# Patient Record
Sex: Female | Born: 1995 | Hispanic: Yes | Marital: Single | Smoking: Never smoker
Health system: Southern US, Community
[De-identification: ages and names within clinical notes are randomized; demographics above are authoritative.]

## PROBLEM LIST (undated history)

## (undated) DIAGNOSIS — K08409 Partial loss of teeth, unspecified cause, unspecified class: Secondary | ICD-10-CM

## (undated) DIAGNOSIS — E282 Polycystic ovarian syndrome: Secondary | ICD-10-CM

## (undated) DIAGNOSIS — E039 Hypothyroidism, unspecified: Secondary | ICD-10-CM

## (undated) DIAGNOSIS — E079 Disorder of thyroid, unspecified: Secondary | ICD-10-CM

## (undated) HISTORY — PX: WISDOM TOOTH EXTRACTION: SHX21

---
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
## 2018-07-13 ENCOUNTER — Other Ambulatory Visit (INDEPENDENT_AMBULATORY_CARE_PROVIDER_SITE_OTHER): Payer: Self-pay | Admitting: Family Medicine

## 2018-08-15 ENCOUNTER — Other Ambulatory Visit (INDEPENDENT_AMBULATORY_CARE_PROVIDER_SITE_OTHER): Payer: Self-pay | Admitting: Family Medicine

## 2018-10-27 ENCOUNTER — Other Ambulatory Visit (INDEPENDENT_AMBULATORY_CARE_PROVIDER_SITE_OTHER): Payer: Self-pay | Admitting: Family Medicine

## 2018-12-26 ENCOUNTER — Other Ambulatory Visit (INDEPENDENT_AMBULATORY_CARE_PROVIDER_SITE_OTHER): Payer: Self-pay | Admitting: Family Medicine

## 2019-03-26 ENCOUNTER — Other Ambulatory Visit (INDEPENDENT_AMBULATORY_CARE_PROVIDER_SITE_OTHER): Payer: Self-pay | Admitting: Family Medicine

## 2019-04-05 ENCOUNTER — Other Ambulatory Visit (INDEPENDENT_AMBULATORY_CARE_PROVIDER_SITE_OTHER): Payer: Self-pay | Admitting: Family Medicine

## 2019-07-24 LAB — CBC AND DIFFERENTIAL
Baso(Absolute): 0.1 10*3/uL (ref 0.0–0.2)
Basos: 1 %
Eos: 3 %
Eosinophils Absolute: 0.2 10*3/uL (ref 0.0–0.4)
Hematocrit: 43.4 % (ref 34.0–46.6)
Hemoglobin: 14.1 g/dL (ref 11.1–15.9)
Immature Granulocytes Absolute: 0 10*3/uL (ref 0.0–0.1)
Immature Granulocytes: 0 %
Lymphocytes Absolute: 2.8 10*3/uL (ref 0.7–3.1)
Lymphocytes: 37 %
MCH: 30.2 pg (ref 26.6–33.0)
MCHC: 32.5 g/dL (ref 31.5–35.7)
MCV: 93 fL (ref 79–97)
Monocytes Absolute: 0.6 10*3/uL (ref 0.1–0.9)
Monocytes: 8 %
Neutrophils Absolute: 3.8 10*3/uL (ref 1.4–7.0)
Neutrophils: 51 %
Platelets: 265 10*3/uL (ref 150–450)
RBC: 4.67 x10E6/uL (ref 3.77–5.28)
RDW: 12.4 % (ref 11.7–15.4)
WBC: 7.4 10*3/uL (ref 3.4–10.8)

## 2019-07-24 LAB — COMPREHENSIVE METABOLIC PANEL
ALT: 15 IU/L (ref 0–32)
AST (SGOT): 24 IU/L (ref 0–40)
Albumin/Globulin Ratio: 2 (ref 1.2–2.2)
Albumin: 4.6 g/dL (ref 3.9–5.0)
Alkaline Phosphatase: 44 IU/L (ref 39–117)
BUN / Creatinine Ratio: 12 (ref 9–23)
BUN: 9 mg/dL (ref 6–20)
Bilirubin, Total: 0.4 mg/dL (ref 0.0–1.2)
CO2: 24 mmol/L (ref 20–29)
Calcium: 9.3 mg/dL (ref 8.7–10.2)
Chloride: 103 mmol/L (ref 96–106)
Creatinine: 0.74 mg/dL (ref 0.57–1.00)
EGFR: 117 mL/min/{1.73_m2} (ref >59)
EGFR: 135 mL/min/{1.73_m2} (ref >59)
Globulin, Total: 2.3 g/dL (ref 1.5–4.5)
Glucose: 84 mg/dL (ref 65–99)
Potassium: 4.4 mmol/L (ref 3.5–5.2)
Protein, Total: 6.9 g/dL (ref 6.0–8.5)
Sodium: 140 mmol/L (ref 134–144)

## 2019-07-24 LAB — EBV ACUTE INFECTION ANTIBODIES
EBV VCA Ab, IgG: 285 U/mL — ABNORMAL HIGH (ref 0.0–17.9)
EBV VCA Ab, IgM: >160 U/mL — ABNORMAL HIGH (ref 0.0–35.9)
Epstein-Barr Virus Early Antigen, IgG: 34.9 U/mL — ABNORMAL HIGH (ref 0.0–8.9)
Epstein-Barr virus, Nuclear AG AB: 312 U/mL — ABNORMAL HIGH (ref 0.0–17.9)

## 2019-07-24 LAB — REFLEX - LYME WESTERN BLOT
Lyme IgG Western Blot: NEGATIVE
Lyme IgM Western Blot: NEGATIVE
P18 Ab.: ABSENT
P23 Ab.: ABSENT
P23 Ab.: ABSENT
P28 Ab.: ABSENT
P30 Ab.: ABSENT
P39 Ab.: ABSENT
P39 Ab.: ABSENT
P41 Ab.: ABSENT
P41 Ab.: ABSENT
P45 Ab.: ABSENT
P58 Ab.: ABSENT
P66 Ab.: ABSENT
P93 Ab.: ABSENT

## 2019-07-24 LAB — THYROID SCREEN
T4, Free: 0.96 ng/dL (ref 0.82–1.77)
TSH: 2.08 u[IU]/mL (ref 0.450–4.500)

## 2019-07-24 LAB — VITAMIN D,25 OH,TOTAL: Vitamin D 25-Hydroxy: 40.3 ng/mL (ref 30.0–100.0)

## 2019-07-24 LAB — SEDIMENTATION RATE: Sed Rate: 7 mm/hr (ref 0–32)

## 2019-07-24 LAB — LYME AB, TOTAL,REFLEX TO WESTERN BLOT (IGG & IGM)
Lyme Disease Ab, Quant, IgM: 1.89 index — ABNORMAL HIGH (ref 0.00–0.79)
Lyme IgG/IgM Ab: 1.12 {ISR} — ABNORMAL HIGH (ref 0.00–0.90)

## 2019-07-24 LAB — ANA W/REFLEX TO MULTIPLE CONFIRMATORY TESTS IF POSITIVE: ANA Direct: NEGATIVE

## 2019-08-01 LAB — CBC AND DIFFERENTIAL
Baso(Absolute): 0.1 10*3/uL (ref 0.0–0.2)
Baso(Absolute): 0.1 10*3/uL (ref 0.0–0.2)
Baso(Absolute): 0.2 10*3/uL (ref 0.0–0.2)
Basos: 1 %
Basos: 2 %
Basos: 3 %
Eos: 2 %
Eos: 2 %
Eos: 4 %
Eosinophils Absolute: 0.1 10*3/uL (ref 0.0–0.4)
Eosinophils Absolute: 0.1 10*3/uL (ref 0.0–0.4)
Eosinophils Absolute: 0.1 10*3/uL (ref 0.0–0.4)
Hematocrit: 37.5 % (ref 34.0–46.6)
Hematocrit: 39.8 % (ref 34.0–46.6)
Hematocrit: 40.6 % (ref 34.0–46.6)
Hemoglobin: 13 g/dL (ref 11.1–15.9)
Hemoglobin: 13.1 g/dL (ref 11.1–15.9)
Hemoglobin: 13.2 g/dL (ref 11.1–15.9)
Immature Granulocytes Absolute: 0 10*3/uL (ref 0.0–0.1)
Immature Granulocytes Absolute: 0 10*3/uL (ref 0.0–0.1)
Immature Granulocytes Absolute: 0 10*3/uL (ref 0.0–0.1)
Immature Granulocytes: 0 %
Immature Granulocytes: 0 %
Immature Granulocytes: 0 %
Lymphocytes Absolute: 2.5 10*3/uL (ref 0.7–3.1)
Lymphocytes Absolute: 1.9 10*3/uL (ref 0.7–3.1)
Lymphocytes Absolute: 4.5 10*3/uL — ABNORMAL HIGH (ref 0.7–3.1)
Lymphocytes: 38 %
Lymphocytes: 47 %
Lymphocytes: 60 %
MCH: 30.4 pg (ref 26.6–33.0)
MCH: 29.5 pg (ref 26.6–33.0)
MCH: 29.8 pg (ref 26.6–33.0)
MCHC: 34.7 g/dL (ref 31.5–35.7)
MCHC: 32.5 g/dL (ref 31.5–35.7)
MCHC: 32.9 g/dL (ref 31.5–35.7)
MCV: 88 fL (ref 79–97)
MCV: 91 fL (ref 79–97)
MCV: 91 fL (ref 79–97)
Monocytes Absolute: 0.5 10*3/uL (ref 0.1–0.9)
Monocytes Absolute: 0.4 10*3/uL (ref 0.1–0.9)
Monocytes Absolute: 1 10*3/uL — ABNORMAL HIGH (ref 0.1–0.9)
Monocytes: 7 %
Monocytes: 14 %
Monocytes: 9 %
Neutrophils Absolute: 3.5 10*3/uL (ref 1.4–7.0)
Neutrophils Absolute: 1.5 10*3/uL (ref 1.4–7.0)
Neutrophils Absolute: 1.6 10*3/uL (ref 1.4–7.0)
Neutrophils: 52 %
Neutrophils: 21 %
Neutrophils: 38 %
Platelets: 216 10*3/uL (ref 150–450)
Platelets: 203 10*3/uL (ref 150–450)
Platelets: 227 10*3/uL (ref 150–450)
RBC: 4.28 x10E6/uL (ref 3.77–5.28)
RBC: 4.4 x10E6/uL (ref 3.77–5.28)
RBC: 4.47 x10E6/uL (ref 3.77–5.28)
RDW: 12.4 % (ref 12.3–15.4)
RDW: 12.8 % (ref 12.3–15.4)
RDW: 13.2 % (ref 12.3–15.4)
WBC: 6.7 10*3/uL (ref 3.4–10.8)
WBC: 4 10*3/uL (ref 3.4–10.8)
WBC: 7.4 10*3/uL (ref 3.4–10.8)

## 2019-08-01 LAB — COMPREHENSIVE METABOLIC PANEL
ALT: 10 IU/L (ref 0–32)
ALT: 99 IU/L — ABNORMAL HIGH (ref 0–32)
AST (SGOT): 24 IU/L (ref 0–40)
AST (SGOT): 96 IU/L — ABNORMAL HIGH (ref 0–40)
Albumin/Globulin Ratio: 1.7 (ref 1.2–2.2)
Albumin/Globulin Ratio: 1.4 (ref 1.2–2.2)
Albumin: 4.3 g/dL (ref 3.5–5.5)
Albumin: 4.1 g/dL (ref 3.5–5.5)
Alkaline Phosphatase: 38 IU/L — ABNORMAL LOW (ref 39–117)
Alkaline Phosphatase: 49 IU/L (ref 39–117)
BUN / Creatinine Ratio: 11 (ref 9–23)
BUN / Creatinine Ratio: 7 — ABNORMAL LOW (ref 9–23)
BUN: 8 mg/dL (ref 6–20)
BUN: 6 mg/dL (ref 6–20)
Bilirubin, Total: 0.4 mg/dL (ref 0.0–1.2)
Bilirubin, Total: 0.3 mg/dL (ref 0.0–1.2)
CO2: 22 mmol/L (ref 20–29)
CO2: 23 mmol/L (ref 20–29)
Calcium: 9.1 mg/dL (ref 8.7–10.2)
Calcium: 8.9 mg/dL (ref 8.7–10.2)
Chloride: 105 mmol/L (ref 96–106)
Chloride: 102 mmol/L (ref 96–106)
Creatinine: 0.75 mg/dL (ref 0.57–1.00)
Creatinine: 0.81 mg/dL (ref 0.57–1.00)
EGFR: 116 mL/min/{1.73_m2} (ref >59)
EGFR: 134 mL/min/{1.73_m2} (ref >59)
EGFR: 106 mL/min/{1.73_m2} (ref >59)
EGFR: 122 mL/min/{1.73_m2} (ref >59)
Globulin, Total: 2.5 g/dL (ref 1.5–4.5)
Globulin, Total: 2.9 g/dL (ref 1.5–4.5)
Glucose: 84 mg/dL (ref 65–99)
Glucose: 99 mg/dL (ref 65–99)
Potassium: 4 mmol/L (ref 3.5–5.2)
Potassium: 3.9 mmol/L (ref 3.5–5.2)
Protein, Total: 6.8 g/dL (ref 6.0–8.5)
Protein, Total: 7 g/dL (ref 6.0–8.5)
Sodium: 142 mmol/L (ref 134–144)
Sodium: 140 mmol/L (ref 134–144)

## 2019-08-01 LAB — THYROID SCREEN
T4, Free: 0.98 ng/dL (ref 0.93–1.60)
T4, Free: 1.03 ng/dL (ref 0.93–1.60)
TSH: 1.14 u[IU]/mL (ref 0.450–4.500)
TSH: 0.757 u[IU]/mL (ref 0.450–4.500)

## 2019-08-01 LAB — REFLEX - LYME WESTERN BLOT
Lyme IgG Western Blot: NEGATIVE
Lyme IgM Western Blot: NEGATIVE
P18 Ab.: ABSENT
P23 Ab.: ABSENT
P23 Ab.: ABSENT
P28 Ab.: ABSENT
P30 Ab.: ABSENT
P39 Ab.: ABSENT
P39 Ab.: ABSENT
P41 Ab.: ABSENT
P41 Ab.: ABSENT
P45 Ab.: ABSENT
P58 Ab.: ABSENT
P66 Ab.: ABSENT
P93 Ab.: ABSENT

## 2019-08-01 LAB — CULTURE, AEROBIC, GENITAL

## 2019-08-01 LAB — IRON PROFILE
Iron Saturation: 22 % (ref 15–55)
Iron: 66 ug/dL (ref 27–159)
TIBC: 306 ug/dL (ref 250–450)
UIBC: 240 ug/dL (ref 131–425)

## 2019-08-01 LAB — EBV ACUTE INFECTION ANTIBODIES
EBV VCA Ab, IgG: 242 U/mL — ABNORMAL HIGH (ref 0.0–17.9)
EBV VCA Ab, IgM: >160 U/mL — ABNORMAL HIGH (ref 0.0–35.9)
Epstein-Barr Virus Early Antigen, IgG: 47.6 U/mL — ABNORMAL HIGH (ref 0.0–8.9)
Epstein-Barr virus, Nuclear AG AB: 129 U/mL — ABNORMAL HIGH (ref 0.0–17.9)

## 2019-08-01 LAB — CHLAMYDIA GONORRHOEAE NAA
CHLAMYDIA TRACHOMATIS, NAA: NEGATIVE
Neisseria gonorrhoeae, NAA: NEGATIVE

## 2019-08-01 LAB — HEPATIC FUNCTION PANEL
ALT: 16 IU/L (ref 0–32)
AST (SGOT): 21 IU/L (ref 0–40)
Albumin: 4.3 g/dL (ref 3.5–5.5)
Alkaline Phosphatase: 41 IU/L (ref 39–117)
Bilirubin Direct: 0.08 mg/dL (ref 0.00–0.40)
Bilirubin, Total: 0.3 mg/dL (ref 0.0–1.2)
Protein, Total: 7.3 g/dL (ref 6.0–8.5)

## 2019-08-01 LAB — HBSAG: Hepatitis B Surface Antigen: NEGATIVE

## 2019-08-01 LAB — LYME AB, TOTAL,REFLEX TO WESTERN BLOT (IGG & IGM)
Lyme Disease Ab, Quant, IgM: 2.37 index — ABNORMAL HIGH (ref 0.00–0.79)
Lyme IgG/IgM Ab: 1.3 {ISR} — ABNORMAL HIGH (ref 0.00–0.90)

## 2019-08-01 LAB — THYROGLOBULIN ANTIBODY: THYROGLOBULIN AB: <1 IU/mL (ref 0.0–0.9)

## 2019-08-01 LAB — LIPID PANEL, WITHOUT TOTAL CHOLESTEROL/HDL RATIO, SERUM
Cholesterol: 121 mg/dL (ref 100–169)
HDL: 41 mg/dL (ref >39)
LDL Calculated: 65 mg/dL (ref 0–109)
Triglycerides: 73 mg/dL (ref 0–89)
VLDL Calculated: 15 mg/dL (ref 5–40)

## 2019-08-01 LAB — HCV AB W/REFLEX TO RIBA PROFILE: HCV AB: <0.1 s/co ratio (ref 0.0–0.9)

## 2019-08-01 LAB — COMMENT:

## 2019-08-01 LAB — VITAMIN B12: Vitamin B-12: 650 pg/mL (ref 232–1245)

## 2019-08-01 LAB — THYROID STIMULATING HORMONE (TSH), REFLEX ON ABNORMAL TO FREE T4, SERUM: TSH: 5.78 u[IU]/mL — ABNORMAL HIGH (ref 0.450–4.500)

## 2019-08-01 LAB — THYROID PEROXIDASE ANTIBODY: Thyroid Peroxidase (TPO) AB: 28 IU/mL — ABNORMAL HIGH (ref 0–26)

## 2019-08-01 LAB — ANA W/REFLEX TO MULTIPLE CONFIRMATORY TESTS IF POSITIVE: ANA Direct: NEGATIVE

## 2019-08-01 LAB — REFLEX - T4,FREE (DIRECT): T4, Free: 1.15 ng/dL (ref 0.93–1.60)

## 2019-08-01 LAB — RHEUMATOID FACTOR: RA Latex Turbid.: <10 IU/mL (ref 0.0–13.9)

## 2019-08-16 ENCOUNTER — Other Ambulatory Visit: Payer: Self-pay | Admitting: Sports Medicine

## 2019-08-16 DIAGNOSIS — G8929 Other chronic pain: Secondary | ICD-10-CM

## 2019-08-22 ENCOUNTER — Other Ambulatory Visit: Payer: Self-pay

## 2019-08-22 ENCOUNTER — Ambulatory Visit
Admission: RE | Admit: 2019-08-22 | Discharge: 2019-08-22 | Disposition: A | Discharge status: Home / Self Care | Payer: BC Managed Care – PPO | Source: Ambulatory Visit | Attending: Sports Medicine | Admitting: Sports Medicine

## 2019-08-22 DIAGNOSIS — M25561 Pain in right knee: Secondary | ICD-10-CM | POA: Diagnosis not present

## 2019-08-22 DIAGNOSIS — G8929 Other chronic pain: Secondary | ICD-10-CM | POA: Diagnosis present

## 2019-08-27 ENCOUNTER — Ambulatory Visit: Payer: Self-pay

## 2019-09-19 ENCOUNTER — Encounter (INDEPENDENT_AMBULATORY_CARE_PROVIDER_SITE_OTHER): Payer: Self-pay | Admitting: Family Medicine

## 2019-09-19 ENCOUNTER — Encounter: Payer: Self-pay | Admitting: Emergency Medicine

## 2019-09-19 ENCOUNTER — Other Ambulatory Visit: Payer: Self-pay

## 2019-09-19 DIAGNOSIS — E039 Hypothyroidism, unspecified: Secondary | ICD-10-CM | POA: Diagnosis not present

## 2019-09-19 DIAGNOSIS — M25512 Pain in left shoulder: Secondary | ICD-10-CM | POA: Insufficient documentation

## 2019-09-19 DIAGNOSIS — R0789 Other chest pain: Secondary | ICD-10-CM | POA: Diagnosis not present

## 2019-09-19 DIAGNOSIS — R079 Chest pain, unspecified: Secondary | ICD-10-CM | POA: Diagnosis present

## 2019-09-19 DIAGNOSIS — R5383 Other fatigue: Secondary | ICD-10-CM | POA: Insufficient documentation

## 2019-09-19 DIAGNOSIS — F32A Depression, unspecified: Secondary | ICD-10-CM | POA: Insufficient documentation

## 2019-09-19 DIAGNOSIS — R42 Dizziness and giddiness: Secondary | ICD-10-CM | POA: Insufficient documentation

## 2019-09-19 DIAGNOSIS — G43909 Migraine, unspecified, not intractable, without status migrainosus: Secondary | ICD-10-CM | POA: Insufficient documentation

## 2019-09-19 DIAGNOSIS — E739 Lactose intolerance, unspecified: Secondary | ICD-10-CM | POA: Insufficient documentation

## 2019-09-19 DIAGNOSIS — R55 Syncope and collapse: Secondary | ICD-10-CM | POA: Insufficient documentation

## 2019-09-19 DIAGNOSIS — R110 Nausea: Secondary | ICD-10-CM | POA: Insufficient documentation

## 2019-09-19 DIAGNOSIS — B2709 Gammaherpesviral mononucleosis with other complications: Secondary | ICD-10-CM | POA: Insufficient documentation

## 2019-09-19 DIAGNOSIS — R519 Headache, unspecified: Secondary | ICD-10-CM | POA: Insufficient documentation

## 2019-09-19 DIAGNOSIS — F419 Anxiety disorder, unspecified: Secondary | ICD-10-CM | POA: Insufficient documentation

## 2019-09-19 DIAGNOSIS — B2790 Infectious mononucleosis, unspecified without complication: Secondary | ICD-10-CM | POA: Insufficient documentation

## 2019-09-19 DIAGNOSIS — R251 Tremor, unspecified: Secondary | ICD-10-CM | POA: Insufficient documentation

## 2019-09-19 LAB — CBC
HCT: 40.4 % (ref 36.0–46.0)
Hemoglobin: 14 g/dL (ref 12.0–15.0)
MCH: 30.2 pg (ref 26.0–34.0)
MCHC: 34.7 g/dL (ref 30.0–36.0)
MCV: 87.3 fL (ref 80.0–100.0)
Platelets: 259 10*3/uL (ref 150–400)
RBC: 4.63 MIL/uL (ref 3.87–5.11)
RDW: 12 % (ref 11.5–15.5)
WBC: 7.7 10*3/uL (ref 4.0–10.5)
nRBC: 0 % (ref 0.0–0.2)

## 2019-09-19 NOTE — ED Triage Notes (Signed)
Pt presents to ED with left sided chest and shoulder pain. Pt states onset of pain was around 2100 while sitting down at home. Pt reports pain started in her left ear and radiated down into her chest. Ear pain has resolved. Pt currently has no increased work of breathing or acute distress noted. Skin warm and dry.

## 2019-09-20 ENCOUNTER — Emergency Department
Admission: EM | Admit: 2019-09-20 | Discharge: 2019-09-20 | Disposition: A | Discharge status: Home / Self Care | Payer: BC Managed Care – PPO | Attending: Emergency Medicine | Admitting: Emergency Medicine

## 2019-09-20 ENCOUNTER — Emergency Department: Payer: BC Managed Care – PPO

## 2019-09-20 DIAGNOSIS — R079 Chest pain, unspecified: Secondary | ICD-10-CM

## 2019-09-20 DIAGNOSIS — R0789 Other chest pain: Secondary | ICD-10-CM

## 2019-09-20 HISTORY — DX: Disorder of thyroid, unspecified: E07.9

## 2019-09-20 HISTORY — DX: Polycystic ovarian syndrome: E28.2

## 2019-09-20 LAB — BASIC METABOLIC PANEL
Anion gap: 11 (ref 5–15)
BUN: 5 mg/dL — ABNORMAL LOW (ref 6–20)
CO2: 25 mmol/L (ref 22–32)
Calcium: 9.2 mg/dL (ref 8.9–10.3)
Chloride: 104 mmol/L (ref 98–111)
Creatinine, Ser: 0.69 mg/dL (ref 0.44–1.00)
GFR calc Af Amer: >60 mL/min (ref >60)
GFR calc non Af Amer: >60 mL/min (ref >60)
Glucose, Bld: 110 mg/dL — ABNORMAL HIGH (ref 70–99)
Potassium: 3.5 mmol/L (ref 3.5–5.1)
Sodium: 140 mmol/L (ref 135–145)

## 2019-09-20 LAB — TROPONIN I (HIGH SENSITIVITY)
Troponin I (High Sensitivity): 2 ng/L (ref <18)
Troponin I (High Sensitivity): 2 ng/L (ref <18)

## 2019-09-20 MED ORDER — SODIUM CHLORIDE 0.9 % IV BOLUS
1000.0000 mL | Freq: Once | INTRAVENOUS | Status: AC
Start: 2019-09-20 — End: 2019-09-20
  Administered 2019-09-20: 1000 mL via INTRAVENOUS

## 2019-09-20 MED ORDER — KETOROLAC TROMETHAMINE 30 MG/ML IJ SOLN
10.0000 mg | Freq: Once | INTRAMUSCULAR | Status: AC
  Administered 2019-09-20: 9.9 mg via INTRAVENOUS
  Filled 2019-09-20: qty 1

## 2019-09-20 MED ORDER — IOHEXOL 350 MG/ML SOLN
100.0000 mL | Freq: Once | INTRAVENOUS | Status: AC | PRN
  Administered 2019-09-20: 100 mL via INTRAVENOUS

## 2019-09-20 NOTE — ED Notes (Signed)
ED Provider at bedside. 

## 2019-09-20 NOTE — ED Notes (Signed)
Patient recently wore a heart monitor for two weeks.

## 2019-09-20 NOTE — ED Provider Notes (Signed)
[facility] Emergency Department Provider Note   ____________________________________________   First MD Initiated Contact with Patient 09/20/19 0202     (approximate)  I have reviewed the triage vital signs and the nursing notes.   HISTORY  Chief Complaint Chest Pain and Shoulder Pain    HPI Alexandria Reeves is a 23 y.o. female who presents to the ED from home with a chief complaint of chest pain.  Patient reports eating around 9 PM and subsequently had sharp left ear pain which radiated into her chest and left shoulder.  Painful on movement.  Recently turned in a 2-week Holter monitor for palpitations and variable blood pressures.  Denies fever, cough, shortness of breath, abdominal pain, nausea or vomiting.  Took a road trip to [place] last weekend and is also taking OCPs.       Past Medical History:  Diagnosis Date  . PCOS (polycystic ovarian syndrome)   . Thyroid disease     There are no active problems to display for this patient.   Past Surgical History:  Procedure Laterality Date  . WISDOM TOOTH EXTRACTION      Prior to Admission medications   Not on File    Allergies Patient has no known allergies.  No family history on file.  Social History Social History   Tobacco Use  . Smoking status: Never Smoker  . Smokeless tobacco: Never Used  Substance Use Topics  . Alcohol use: Not Currently  . Drug use: Never    Review of Systems  Constitutional: No fever/chills Eyes: No visual changes. ENT: Positive for left ear pain.  No sore throat. Cardiovascular: Positive for chest pain. Respiratory: Denies shortness of breath. Gastrointestinal: No abdominal pain.  No nausea, no vomiting.  No diarrhea.  No constipation. Genitourinary: Negative for dysuria. Musculoskeletal: Negative for back pain. Skin: Negative for rash. Neurological: Negative for headaches, focal weakness or numbness.   ____________________________________________   PHYSICAL EXAM:  VITAL SIGNS: ED Triage Vitals  Enc Vitals Group     BP 09/19/19 2324 138/82     Pulse Rate 09/19/19 2324 67     Resp 09/19/19 2324 18     Temp 09/19/19 2324 98.7 F (37.1 C)     Temp Source 09/19/19 2324 Oral     SpO2 09/19/19 2324 98 %     Weight 09/19/19 2325 145 lb (65.8 kg)     Height 09/19/19 2325 5\' 6"  (1.676 m)     Head Circumference --      Peak Flow --      Pain Score 09/19/19 2325 7     Pain Loc --      Pain Edu? --      Excl. in [place]? --     Constitutional: Alert and oriented. Well appearing and in no acute distress. Eyes: Conjunctivae are normal. PERRL. EOMI. Head: Atraumatic. Nose: No congestion/rhinnorhea. Ears: Bilateral TMs unremarkable. Mouth/Throat: Mucous membranes are moist.  Oropharynx non-erythematous. Neck: No stridor.   Cardiovascular: Normal rate, regular rhythm. Grossly normal heart sounds.  Good peripheral circulation. Respiratory: Normal respiratory effort.  No retractions. Lungs CTAB.  Anterior chest tender to palpation and with movement of trunk. Gastrointestinal: Soft and nontender. No distention. No abdominal bruits. No CVA tenderness. Musculoskeletal: No lower extremity tenderness nor edema.  No joint effusions. Neurologic:  Normal speech and language. No gross focal neurologic deficits are appreciated. No gait instability. Skin:  Skin is warm, dry and intact. No rash noted. Psychiatric: Mood and affect are  normal. Speech and behavior are normal.  ____________________________________________   LABS (all labs ordered are listed, but only abnormal results are displayed)  Labs Reviewed  BASIC METABOLIC PANEL - Abnormal; Notable for the following components:      Result Value   Glucose, Bld 110 (*)    BUN 5 (*)    All other components within normal limits  CBC  POC URINE PREG, ED  TROPONIN I (HIGH SENSITIVITY)  TROPONIN I (HIGH SENSITIVITY)   ____________________________________________  EKG  ED ECG REPORT I,  Crissie Aloi J, the attending physician, personally viewed and interpreted this ECG.   Date: 09/20/2019  EKG Time: 2329  Rate: 65  Rhythm: normal EKG, normal sinus rhythm  Axis: Normal  Intervals:none  ST&T Change: Nonspecific  ____________________________________________  RADIOLOGY  ED MD interpretation: No acute cardiopulmonary process; no PE  Official radiology report(s): Dg Chest 2 View  Result Date: 09/20/2019 CLINICAL DATA:  Chest pain. Left-sided chest and shoulder pain. EXAM: CHEST - 2 VIEW COMPARISON:  None. FINDINGS: The cardiomediastinal contours are normal. The lungs are clear. Pulmonary vasculature is normal. No consolidation, pleural effusion, or pneumothorax. No acute osseous abnormalities are seen. IMPRESSION: Normal radiographs of the chest. Electronically Signed   By: Narda RutherfordMelanie  Sanford M.D.   On: 09/20/2019 01:08   Ct Angio Chest Pe W/cm &/or Wo Cm  Result Date: 09/20/2019 CLINICAL DATA:  Left chest and shoulder pain. EXAM: CT ANGIOGRAPHY CHEST WITH CONTRAST TECHNIQUE: Multidetector CT imaging of the chest was performed using the standard protocol during bolus administration of intravenous contrast. Multiplanar CT image reconstructions and MIPs were obtained to evaluate the vascular anatomy. CONTRAST:  100mL OMNIPAQUE IOHEXOL 350 MG/ML SOLN COMPARISON:  Radiograph earlier this day. FINDINGS: Cardiovascular: There are no filling defects within the pulmonary arteries to suggest pulmonary embolus. The thoracic aorta is normal in caliber. No dissection. Heart is normal in size. No pericardial effusion. Mediastinum/Nodes: Residual thymus in the anterior mediastinum. No adenopathy. No visualized thyroid nodule. Esophagus is decompressed. Lungs/Pleura: Clear lungs. No consolidation, pleural fluid, or pulmonary edema. Upper Abdomen: Negative. Musculoskeletal: No chest wall abnormality. No acute or significant osseous findings. Review of the MIP images confirms the above findings.  IMPRESSION: Negative CTA of the chest. No pulmonary embolus or acute intrathoracic abnormality. Electronically Signed   By: Narda RutherfordMelanie  Sanford M.D.   On: 09/20/2019 03:28    ____________________________________________   PROCEDURES  Procedure(s) performed (including Critical Care):  Procedures   ____________________________________________   INITIAL IMPRESSION / ASSESSMENT AND PLAN / ED COURSE  As part of my medical decision making, I reviewed the following data within the electronic MEDICAL RECORD NUMBER History obtained from family, Nursing notes reviewed and incorporated, Labs reviewed, EKG interpreted, Old chart reviewed, Radiograph reviewed and Notes from prior ED visits     Tish Fredericksonlexis Brickner was evaluated in Emergency Department on 09/20/2019 for the symptoms described in the history of present illness. She was evaluated in the context of the global COVID-19 pandemic, which necessitated consideration that the patient might be at risk for infection with the SARS-CoV-2 virus that causes COVID-19. Institutional protocols and algorithms that pertain to the evaluation of patients at risk for COVID-19 are in a state of rapid change based on information released by regulatory bodies including the CDC and federal and state organizations. These policies and algorithms were followed during the patient's care in the ED.    23 year old female who presents with pleuritic chest pain. Differential diagnosis includes, but is not limited to, ACS, aortic dissection,  pulmonary embolism, cardiac tamponade, pneumothorax, pneumonia, pericarditis, myocarditis, GI-related causes including esophagitis/gastritis, and musculoskeletal chest wall pain.    Initial EKG and troponin unremarkable.  Will obtain CTA chest to evaluate for PE. Administer IV Toradol for discomfort.   Clinical Course as of Sep 20 347  Thu Sep 20, 2019  3790 Updated patient on negative repeat troponin and CT scan.  She is feeling better after  IV Toradol.  Strict return precautions given.  Patient verbalizes understanding and agrees with plan of care.   [JS]    Clinical Course User Index [JS] Irean Hong, MD     ____________________________________________   FINAL CLINICAL IMPRESSION(S) / ED DIAGNOSES  Final diagnoses:  Chest wall pain  Chest pain, unspecified type     ED Discharge Orders    None       Note:  This document was prepared using Dragon voice recognition software and may include unintentional dictation errors.   Irean Hong, MD 09/20/19 [phone]

## 2019-09-20 NOTE — ED Notes (Signed)
Patient states having ear pain chest pain localized on left side.

## 2019-09-20 NOTE — Discharge Instructions (Addendum)
You may take Tylenol and/or Ibuprofen as needed for chest discomfort.  Apply moist heat to affected area several times daily.  Return to the ER for worsening symptoms, persistent vomiting, difficulty breathing or other concerns.

## 2019-09-22 ENCOUNTER — Other Ambulatory Visit (INDEPENDENT_AMBULATORY_CARE_PROVIDER_SITE_OTHER): Payer: Self-pay | Admitting: Family Medicine

## 2019-09-22 DIAGNOSIS — E039 Hypothyroidism, unspecified: Secondary | ICD-10-CM

## 2019-09-22 MED ORDER — LEVOTHYROXINE SODIUM 25 MCG PO TABS
ORAL_TABLET | ORAL | 1 refills | Status: DC
Start: 2019-09-22 — End: 2020-06-10

## 2019-09-22 NOTE — Progress Notes (Signed)
Received fax from [facility] for rx refill request of Levothyroxine .  Sent in rx #90 with 1RF along with reminder for pt to schedule WWE.

## 2019-09-24 ENCOUNTER — Encounter (INDEPENDENT_AMBULATORY_CARE_PROVIDER_SITE_OTHER): Payer: Self-pay | Admitting: Family Medicine

## 2019-09-24 ENCOUNTER — Encounter (INDEPENDENT_AMBULATORY_CARE_PROVIDER_SITE_OTHER): Payer: BLUE CROSS/BLUE SHIELD | Admitting: Family Medicine

## 2019-09-24 NOTE — Progress Notes (Signed)
This encounter was created in error - please disregard.    Items noted as "reviewed" are for administrative purposes only and are not guaranteed by the provider to be accurate on this date.

## 2019-10-25 ENCOUNTER — Encounter (INDEPENDENT_AMBULATORY_CARE_PROVIDER_SITE_OTHER): Payer: Self-pay

## 2019-11-27 ENCOUNTER — Other Ambulatory Visit: Payer: Self-pay | Admitting: Orthopedic Surgery

## 2019-11-28 ENCOUNTER — Other Ambulatory Visit: Payer: Self-pay

## 2019-11-30 ENCOUNTER — Other Ambulatory Visit
Admission: RE | Admit: 2019-11-30 | Discharge: 2019-11-30 | Disposition: A | Discharge status: Home / Self Care | Payer: BC Managed Care – PPO | Source: Ambulatory Visit | Attending: Orthopedic Surgery | Admitting: Orthopedic Surgery

## 2019-11-30 DIAGNOSIS — Z01812 Encounter for preprocedural laboratory examination: Secondary | ICD-10-CM | POA: Diagnosis not present

## 2019-11-30 DIAGNOSIS — Z20828 Contact with and (suspected) exposure to other viral communicable diseases: Secondary | ICD-10-CM | POA: Insufficient documentation

## 2019-11-30 LAB — SARS CORONAVIRUS 2 (TAT 6-24 HRS): SARS Coronavirus 2: NEGATIVE

## 2019-12-03 NOTE — Anesthesia Preprocedure Evaluation (Addendum)
Anesthesia Evaluation  Patient identified by MRN, date of birth, ID band Patient awake    Reviewed: Allergy & Precautions, NPO status , Patient's Chart, lab work & pertinent test results  History of Anesthesia Complications Negative for: history of anesthetic complications  Airway Mallampati: I   Neck ROM: Full    Dental   Upper temporary crown:   Pulmonary neg pulmonary ROS,    Pulmonary exam normal breath sounds clear to auscultation       Cardiovascular Exercise Tolerance: Good negative cardio ROS Normal cardiovascular exam Rhythm:Regular Rate:Normal  ECG 09/19/19: NSR with sinus arrhythmia   Neuro/Psych negative neurological ROS     GI/Hepatic negative GI ROS,   Endo/Other  Hypothyroidism PCOS  Renal/GU negative Renal ROS     Musculoskeletal   Abdominal   Peds  Hematology negative hematology ROS (+)   Anesthesia Other Findings   Reproductive/Obstetrics                            Anesthesia Physical Anesthesia Plan  ASA: II  Anesthesia Plan: General   Post-op Pain Management:    Induction:   PONV Risk Score and Plan: 3 and Dexamethasone, Ondansetron and Treatment may vary due to age or medical condition  Airway Management Planned: LMA  Additional Equipment:   Intra-op Plan:   Post-operative Plan: Extubation in OR  Informed Consent: I have reviewed the patients History and Physical, chart, labs and discussed the procedure including the risks, benefits and alternatives for the proposed anesthesia with the patient or authorized representative who has indicated his/her understanding and acceptance.       Plan Discussed with: CRNA  Anesthesia Plan Comments:        Anesthesia Quick Evaluation

## 2019-12-04 ENCOUNTER — Encounter
Admission: RE | Disposition: A | Discharge status: Home / Self Care | Payer: Self-pay | Source: Home / Self Care | Attending: Orthopedic Surgery

## 2019-12-04 ENCOUNTER — Other Ambulatory Visit: Payer: Self-pay

## 2019-12-04 ENCOUNTER — Ambulatory Visit: Payer: BC Managed Care – PPO | Admitting: Anesthesiology

## 2019-12-04 ENCOUNTER — Ambulatory Visit
Admission: RE | Admit: 2019-12-04 | Discharge: 2019-12-04 | Disposition: A | Discharge status: Home / Self Care | Payer: BC Managed Care – PPO | Attending: Orthopedic Surgery | Admitting: Orthopedic Surgery

## 2019-12-04 DIAGNOSIS — Z791 Long term (current) use of non-steroidal anti-inflammatories (NSAID): Secondary | ICD-10-CM | POA: Diagnosis not present

## 2019-12-04 DIAGNOSIS — Z79890 Hormone replacement therapy (postmenopausal): Secondary | ICD-10-CM | POA: Insufficient documentation

## 2019-12-04 DIAGNOSIS — M25861 Other specified joint disorders, right knee: Secondary | ICD-10-CM | POA: Insufficient documentation

## 2019-12-04 DIAGNOSIS — Z793 Long term (current) use of hormonal contraceptives: Secondary | ICD-10-CM | POA: Insufficient documentation

## 2019-12-04 DIAGNOSIS — M6751 Plica syndrome, right knee: Secondary | ICD-10-CM | POA: Diagnosis not present

## 2019-12-04 DIAGNOSIS — F419 Anxiety disorder, unspecified: Secondary | ICD-10-CM | POA: Diagnosis not present

## 2019-12-04 DIAGNOSIS — M238X1 Other internal derangements of right knee: Secondary | ICD-10-CM | POA: Diagnosis not present

## 2019-12-04 DIAGNOSIS — E039 Hypothyroidism, unspecified: Secondary | ICD-10-CM | POA: Insufficient documentation

## 2019-12-04 HISTORY — PX: KNEE ARTHROSCOPY WITH EXCISION PLICA: SHX5647

## 2019-12-04 HISTORY — DX: Partial loss of teeth, unspecified cause, unspecified class: K08.409

## 2019-12-04 HISTORY — DX: Hypothyroidism, unspecified: E03.9

## 2019-12-04 LAB — POCT PREGNANCY, URINE: Preg Test, Ur: NEGATIVE

## 2019-12-04 SURGERY — ARTHROSCOPY, KNEE, WITH PLICA EXCISION
Anesthesia: General | Site: Knee | Laterality: Right

## 2019-12-04 MED ORDER — GLYCOPYRROLATE 0.2 MG/ML IJ SOLN
INTRAMUSCULAR | Status: DC | PRN
  Administered 2019-12-04: 0.1 mg via INTRAVENOUS

## 2019-12-04 MED ORDER — OXYCODONE HCL 5 MG PO TABS: 5.0000 mg | ORAL_TABLET | Freq: Once | ORAL | Status: AC | PRN

## 2019-12-04 MED ORDER — ONDANSETRON 4 MG PO TBDP: 4.0000 mg | ORAL_TABLET | Freq: Three times a day (TID) | ORAL | 0 refills | Status: AC | PRN

## 2019-12-04 MED ORDER — ACETAMINOPHEN 500 MG PO TABS: 1000.0000 mg | ORAL_TABLET | Freq: Three times a day (TID) | ORAL | 2 refills | Status: AC

## 2019-12-04 MED ORDER — CHLORHEXIDINE GLUCONATE 4 % EX LIQD: 60.0000 mL | Freq: Once | CUTANEOUS | Status: DC

## 2019-12-04 MED ORDER — LACTATED RINGERS IV SOLN: INTRAVENOUS | Status: DC

## 2019-12-04 MED ORDER — PROPOFOL 10 MG/ML IV BOLUS
INTRAVENOUS | Status: DC | PRN
  Administered 2019-12-04: 190 mg via INTRAVENOUS

## 2019-12-04 MED ORDER — LACTATED RINGERS IV SOLN
10.0000 mL/h | INTRAVENOUS | Status: DC
  Administered 2019-12-04: 14:00:00 via INTRAVENOUS

## 2019-12-04 MED ORDER — DEXAMETHASONE SODIUM PHOSPHATE 4 MG/ML IJ SOLN
INTRAMUSCULAR | Status: DC | PRN
  Administered 2019-12-04: 4 mg via INTRAVENOUS

## 2019-12-04 MED ORDER — OXYCODONE HCL 5 MG/5ML PO SOLN
5.0000 mg | Freq: Once | ORAL | Status: AC | PRN
  Administered 2019-12-04: 5 mg via ORAL

## 2019-12-04 MED ORDER — LIDOCAINE HCL (CARDIAC) PF 100 MG/5ML IV SOSY
PREFILLED_SYRINGE | INTRAVENOUS | Status: DC | PRN
  Administered 2019-12-04: 30 mg via INTRATRACHEAL

## 2019-12-04 MED ORDER — ASPIRIN EC 325 MG PO TBEC: 325.0000 mg | DELAYED_RELEASE_TABLET | Freq: Every day | ORAL | 0 refills | Status: AC

## 2019-12-04 MED ORDER — ONDANSETRON HCL 4 MG/2ML IJ SOLN: 4.0000 mg | Freq: Once | INTRAMUSCULAR | Status: DC | PRN

## 2019-12-04 MED ORDER — FENTANYL CITRATE (PF) 100 MCG/2ML IJ SOLN
25.0000 ug | INTRAMUSCULAR | Status: DC | PRN
  Administered 2019-12-04 (×2): 25 ug via INTRAVENOUS

## 2019-12-04 MED ORDER — LIDOCAINE-EPINEPHRINE 1 %-1:100000 IJ SOLN
INTRAMUSCULAR | Status: DC | PRN
  Administered 2019-12-04: 15 mL via INTRAMUSCULAR

## 2019-12-04 MED ORDER — ONDANSETRON HCL 4 MG/2ML IJ SOLN
INTRAMUSCULAR | Status: DC | PRN
  Administered 2019-12-04: 4 mg via INTRAVENOUS

## 2019-12-04 MED ORDER — MIDAZOLAM HCL 5 MG/5ML IJ SOLN
INTRAMUSCULAR | Status: DC | PRN
  Administered 2019-12-04: 2 mg via INTRAVENOUS

## 2019-12-04 MED ORDER — CEFAZOLIN SODIUM-DEXTROSE 2-4 GM/100ML-% IV SOLN
2.0000 g | INTRAVENOUS | Status: AC
  Administered 2019-12-04: 14:00:00 2 g via INTRAVENOUS

## 2019-12-04 MED ORDER — FENTANYL CITRATE (PF) 100 MCG/2ML IJ SOLN
INTRAMUSCULAR | Status: DC | PRN
  Administered 2019-12-04 (×4): 25 ug via INTRAVENOUS

## 2019-12-04 MED ORDER — ACETAMINOPHEN 10 MG/ML IV SOLN: 1000.0000 mg | Freq: Once | INTRAVENOUS | Status: DC | PRN

## 2019-12-04 MED ORDER — IBUPROFEN 800 MG PO TABS: 800.0000 mg | ORAL_TABLET | Freq: Three times a day (TID) | ORAL | 1 refills | Status: AC

## 2019-12-04 MED ORDER — HYDROCODONE-ACETAMINOPHEN 5-325 MG PO TABS: 1.0000 | ORAL_TABLET | ORAL | 0 refills | Status: AC | PRN

## 2019-12-04 SURGICAL SUPPLY — 48 items
ADAPTER IRRIG TUBE 2 SPIKE SOL (ADAPTER) ×6 IMPLANT
BLADE SURG 15 STRL LF DISP TIS (BLADE) IMPLANT
BLADE SURG 15 STRL SS (BLADE)
BLADE SURG SZ11 CARB STEEL (BLADE) ×3 IMPLANT
BNDG COHESIVE 4X5 TAN STRL (GAUZE/BANDAGES/DRESSINGS) ×3 IMPLANT
BNDG ESMARK 6X12 TAN STRL LF (GAUZE/BANDAGES/DRESSINGS) IMPLANT
BUR RADIUS 3.5 (BURR) ×3 IMPLANT
BUR RADIUS 4.0X18.5 (BURR) IMPLANT
CHLORAPREP W/TINT 26ML (MISCELLANEOUS) ×3 IMPLANT
CLOSURE WOUND 1/2 X4 (GAUZE/BANDAGES/DRESSINGS)
COOLER POLAR GLACIER W/PUMP (MISCELLANEOUS) ×3 IMPLANT
COVER LIGHT HANDLE UNIVERSAL (MISCELLANEOUS) ×6 IMPLANT
CUFF TOURN SGL QUICK 30 (TOURNIQUET CUFF)
CUFF TOURN SGL QUICK 34 (TOURNIQUET CUFF) ×2
CUFF TRNQT CYL 30X4X21-28X (TOURNIQUET CUFF) IMPLANT
CUFF TRNQT CYL 34X4X40X1 (TOURNIQUET CUFF) ×1 IMPLANT
DECANTER SPIKE VIAL GLASS SM (MISCELLANEOUS) ×3 IMPLANT
DRAPE IMP U-DRAPE 54X76 (DRAPES) ×3 IMPLANT
DRAPE U-SHAPE 48X52 POLY STRL (PACKS) ×3 IMPLANT
GAUZE SPONGE 4X4 12PLY STRL (GAUZE/BANDAGES/DRESSINGS) ×3 IMPLANT
GLOVE BIO SURGEON STRL SZ7.5 (GLOVE) ×3 IMPLANT
GLOVE BIOGEL PI IND STRL 8 (GLOVE) ×1 IMPLANT
GLOVE BIOGEL PI INDICATOR 8 (GLOVE) ×2
GOWN STRL REIN 2XL XLG LVL4 (GOWN DISPOSABLE) ×6 IMPLANT
GOWN STRL REUS W/ TWL LRG LVL3 (GOWN DISPOSABLE) IMPLANT
GOWN STRL REUS W/TWL LRG LVL3 (GOWN DISPOSABLE) IMPLANT
IV LACTATED RINGER IRRG 3000ML (IV SOLUTION) ×8
IV LR IRRIG 3000ML ARTHROMATIC (IV SOLUTION) ×4 IMPLANT
KIT TURNOVER KIT A (KITS) ×3 IMPLANT
MAT ABSORB  FLUID 56X50 GRAY (MISCELLANEOUS) ×2
MAT ABSORB FLUID 56X50 GRAY (MISCELLANEOUS) ×1 IMPLANT
NEEDLE HYPO 21X1.5 SAFETY (NEEDLE) ×3 IMPLANT
NEPTUNE MANIFOLD (MISCELLANEOUS) ×3 IMPLANT
PACK ARTHROSCOPY KNEE (MISCELLANEOUS) ×3 IMPLANT
PAD ABD DERMACEA PRESS 5X9 (GAUZE/BANDAGES/DRESSINGS) ×6 IMPLANT
PAD WRAPON POLAR KNEE (MISCELLANEOUS) ×1 IMPLANT
PADDING CAST BLEND 6X4 STRL (MISCELLANEOUS) ×1 IMPLANT
PADDING STRL CAST 6IN (MISCELLANEOUS) ×2
SET TUBE SUCT SHAVER OUTFL 24K (TUBING) ×3 IMPLANT
SET TUBE TIP INTRA-ARTICULAR (MISCELLANEOUS) ×3 IMPLANT
STRIP CLOSURE SKIN 1/2X4 (GAUZE/BANDAGES/DRESSINGS) IMPLANT
SUT ETHILON 3-0 FS-10 30 BLK (SUTURE) ×3
SUTURE EHLN 3-0 FS-10 30 BLK (SUTURE) ×1 IMPLANT
TOWEL OR 17X26 4PK STRL BLUE (TOWEL DISPOSABLE) ×6 IMPLANT
TUBING ARTHRO INFLOW-ONLY STRL (TUBING) ×3 IMPLANT
WAND HAND CNTRL MULTIVAC 50 (MISCELLANEOUS) IMPLANT
WAND WEREWOLF FLOW 90D (MISCELLANEOUS) ×3 IMPLANT
WRAPON POLAR PAD KNEE (MISCELLANEOUS) ×3

## 2019-12-04 NOTE — Transfer of Care (Signed)
Immediate Anesthesia Transfer of Care Note  Patient: Huel Cote  Procedure(s) Performed: KNEE ARTHROSCOPY WITH MEDIAL EXCISION PLICA, PATELLA CHONDROPLASTY, MACI BIOPSY, MEDIAL MENISCUS REPAIR (Right Knee)  Patient Location: PACU  Anesthesia Type: General  Level of Consciousness: awake, alert  and patient cooperative  Airway and Oxygen Therapy: Patient Spontanous Breathing and Patient connected to supplemental oxygen  Post-op Assessment: Post-op Vital signs reviewed, Patient's Cardiovascular Status Stable, Respiratory Function Stable, Patent Airway and No signs of Nausea or vomiting  Post-op Vital Signs: Reviewed and stable  Complications: No apparent anesthesia complications

## 2019-12-04 NOTE — Op Note (Signed)
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 Right patella chondroplasty  2. Right knee autologous chondrocyte biopsy 3. Right knee partial synovectomy of Hoffa's fat pad  SURGEON:  Novella Olive, MD  ASSISTANT(S):  None  ANESTHESIA: Gen w/LMA  TOTAL IV FLUIDS: See anesthesia record  ESTIMATED BLOOD LOSS: Minimal  TOURNIQUET TIME:  46 min  DRAINS:  None.  SPECIMENS: Autologous chondrocyte biopsy sent to [facility].  IMPLANTS: None.  COMPLICATIONS: none  INDICATIONS: Alexandria Reeves is a 23 y.o. female with a history of right knee pain and swelling without specific that began after a snowboarding injury approximately 3 years ago.  She had significant swelling and bruising at that time but was able to eventually return to all activities. Over the past 5 months, the patient has had increased pain that began with recreational activities such as running and hiking, but has now progressed to daily activities such as walking around the block.  She underwent an MRI which was reportedly normal, but on personal evaluation, there was a region of patellar chondral pathology and fat pad impingement.  She has undergone extensive nonoperative management including physical therapy, activity modifications, medical management, and PRP injections, none of which have resolved her symptoms. Given these findings, symptoms, and failure of conservative management, I recommended right knee arthroscopy, patellar chondroplasty, partial synovectomy with fat pad debridement, and articular cartilage biopsy for potential future MACI procedure.   We did discuss that the patient may have continued pain after the procedure given the lack of obvious pathology.  After discussion of risks, benefits, and alternatives to surgery,  the patient elected to proceed.    OPERATIVE FINDINGS:   Examination under anesthesia:A careful examination under anesthesia was performed. Passive range of motion was: Hyperextension:2. Extension: 0. Flexion: 140. Lachman: normal. Pivot Shift: normal. Posterior drawer: normal. Varus stability in full extension: normal. Varus stability in 30 degrees of flexion: normal. Valgus stability in full extension: normal. Valgus stability in 30 degrees of flexion: normal.  Intra-operative findings:A thorough arthroscopic examination of the knee was performed. The findings are: 1. Suprapatellar pouch: Normal 2. Undersurface of median ridge: Extension of lateral patellar facet grade 3 chondral defect 3. Medial patellar facet: Grade 1 softening of the medial patellar facet 4. Lateral patellar facet: Grade 3 chondral defect measuring approximately 12 mm superior to inferior x 13 mm medial to lateral, spanning the lateral patellar facet with extension to the median ridge  5. Trochlea: Normal 6. Lateral gutter/popliteus tendon: Normal 7. Hoffa's fat pad: Inflamed and thickened with impingement in the patellofemoral joint 8. Medial gutter/plica: Medial plica band present 9. ACL: Normal 10. PCL: Normal 11. Medial meniscus: Normal 12. Medial compartment cartilage: Normal 13. Lateral meniscus: Normal 14. Lateral compartment cartilage: Normal  OPERATIVE REPORT:   I identified Alexandria Reeves the pre-operative holding area. I marked theoperativeknee with my initials. I reviewed the risks and benefits of the proposed surgical intervention and the patient (and/or patient's guardian) wished to proceed. The patient was transferred to the operative suite and placed in the supine position with all bony prominences padded. Anesthesia was administered. Appropriate IV antibioticswere administered prior to incision. The extremity was then prepped and draped in standard fashion. A time out was  performed confirming the correct extremity, correct patient,and correct procedure.  Arthroscopy portals were marked. Local anesthetic was  injected to the planned portal sites. The anterolateral portalwasestablished with an 11blade.   The arthroscope was placed in the anterolateral portal and theninto the suprapatellar pouch. A diagnostic knee scope was completed with the above findings.   Next the medial portal was established under needle localization.  Partial synovectomy was performed by performing an extensive debridement of Hoffa's fat pad with an oscillating shaver until there was no further impingement in the patellofemoral joint.   The medial plica band was resected with an oscillating shaver and ArthroCare wand. Next, the chondral defect of the patella was identified.  A gentle chondroplasty was performed with an oscillating shaver such that there were stable cartilage edges without any loose fragments of cartilage.  An arthroscopic probe was used to measure the size of the defect.  Finally, an articular cartilage biopsy was performed from the lateral wall of the intercondylar notch using an osteotome.  Arthroscopic fluid was removed from the joint.   The portals were closed with 3-0 Nylon suture. Sterile dressings included Xeroform, 4x4s, Sof-Rol, and Ace wrap. A Polarcare was placed.The patient was then awakened and taken to the PACU hemodynamically stable without complication.   POSTOPERATIVE PLAN: The patient will be discharged home today once they meet PACU criteria. Physical therapy will starton POD#3-4.Weight-bearing as tolerated. Follow up in 2 weeks per protocol.

## 2019-12-04 NOTE — H&P (Signed)
Paper H&P to be scanned into permanent record. H&P reviewed. No significant changes noted.  

## 2019-12-04 NOTE — Anesthesia Postprocedure Evaluation (Signed)
Anesthesia Post Note  Patient: Huel Cote  Procedure(s) Performed: KNEE ARTHROSCOPY WITH MEDIAL EXCISION PLICA, PATELLA CHONDROPLASTY, MACI BIOPSY, MEDIAL MENISCUS REPAIR (Right Knee)     Patient location during evaluation: PACU Anesthesia Type: General Level of consciousness: awake and alert, oriented and patient cooperative Pain management: pain level controlled Vital Signs Assessment: post-procedure vital signs reviewed and stable Respiratory status: spontaneous breathing, nonlabored ventilation and respiratory function stable Cardiovascular status: blood pressure returned to baseline and stable Postop Assessment: adequate PO intake Anesthetic complications: no    Darrin Nipper

## 2019-12-04 NOTE — Discharge Instructions (Signed)
Arthroscopic Knee Surgery °  °Post-Op Instructions °  °1. Bracing or crutches: Crutches will be provided at the time of discharge from the surgery center if you do not already have them. °  °2. Ice: You may be provided with a device (Polar Care) that allows you to ice the affected area effectively. Otherwise you can ice manually.  °  °3. Driving:  Plan on not driving for at least one week. Please note that you are advised NOT to drive while taking narcotic pain medications as you may be impaired and unsafe to drive. °  °4. Activity: Ankle pumps several times an hour while awake to prevent blood clots. Weight bearing: as tolerated. Use crutches for as needed (usually ~1 week or less) until pain allows you to ambulate without a limp. Bending and straightening the knee is unlimited. Elevate knee above heart level as much as possible for one week. Avoid standing more than 5 minutes (consecutively) for the first week.  Avoid long distance travel for 2 weeks. °  °5. Medications:  °- You have been provided a prescription for narcotic pain medicine. After surgery, take 1-2 narcotic tablets every 4 hours if needed for severe pain.  °- You may take up to 3000mg/day of tylenol (acetaminophen). You can take 1000mg 3x/day. Please check your narcotic. If you have acetaminophen in your narcotic (each tablet will be 325mg), be careful not to exceed a total of 3000mg/day of acetaminophen.  °- A prescription for anti-nausea medication will be provided in case the narcotic medicine causes nausea - take 1 tablet every 6 hours only if nauseated.  °- Take ibuprofen 800 mg every 8 hours WITH food to reduce post-operative knee swelling. DO NOT STOP IBUPROFEN POST-OP UNTIL INSTRUCTED TO DO SO at first post-op office visit (10-14 days after surgery). However, please discontinue if you have any abdominal discomfort after taking this.  °- Take enteric coated aspirin 325 mg once daily for 2 weeks to prevent blood clots.  ° °6. Bandages: The  physical therapist should change the bandages at the first post-op appointment. If needed, the dressing supplies have been provided to you. °  °7. Physical Therapy: 1-2 times per week for 6 weeks. Therapy typically starts on post operative Day 3 or 4. You have been provided an order for physical therapy. The therapist will provide home exercises. °  °8. Work: May return to full work usually around 2 weeks after 1st post-operative visit. May do light duty/desk job in approximately 1-2 weeks when off of narcotics, pain is well-controlled, and swelling has decreased. Labor intensive jobs may require 4-6 weeks to return.  °  °  °9. Post-Op Appointments: °Your first post-op appointment will be with Dr. Nyssa Sayegh in approximately 2 weeks time.  °  °If you find that they have not been scheduled please call the Orthopaedic Appointment front desk at [phone]. ° ° ° °General Anesthesia, Adult, Care After °This sheet gives you information about how to care for yourself after your procedure. Your health care provider may also give you more specific instructions. If you have problems or questions, contact your health care provider. °What can I expect after the procedure? °After the procedure, the following side effects are common: °· Pain or discomfort at the IV site. °· Nausea. °· Vomiting. °· Sore throat. °· Trouble concentrating. °· Feeling cold or chills. °· Weak or tired. °· Sleepiness and fatigue. °· Soreness and body aches. These side effects can affect parts of the body that were not   involved in surgery. °Follow these instructions at home: ° °For at least 24 hours after the procedure: °· Have a responsible adult stay with you. It is important to have someone help care for you until you are awake and alert. °· Rest as needed. °· Do not: °? Participate in activities in which you could fall or become injured. °? Drive. °? Use heavy machinery. °? Drink alcohol. °? Take sleeping pills or medicines that cause  drowsiness. °? Make important decisions or sign legal documents. °? Take care of children on your own. °Eating and drinking °· Follow any instructions from your health care provider about eating or drinking restrictions. °· When you feel hungry, start by eating small amounts of foods that are soft and easy to digest (bland), such as toast. Gradually return to your regular diet. °· Drink enough fluid to keep your urine pale yellow. °· If you vomit, rehydrate by drinking water, juice, or clear broth. °General instructions °· If you have sleep apnea, surgery and certain medicines can increase your risk for breathing problems. Follow instructions from your health care provider about wearing your sleep device: °? Anytime you are sleeping, including during daytime naps. °? While taking prescription pain medicines, sleeping medicines, or medicines that make you drowsy. °· Return to your normal activities as told by your health care provider. Ask your health care provider what activities are safe for you. °· Take over-the-counter and prescription medicines only as told by your health care provider. °· If you smoke, do not smoke without supervision. °· Keep all follow-up visits as told by your health care provider. This is important. °Contact a health care provider if: °· You have nausea or vomiting that does not get better with medicine. °· You cannot eat or drink without vomiting. °· You have pain that does not get better with medicine. °· You are unable to pass urine. °· You develop a skin rash. °· You have a fever. °· You have redness around your IV site that gets worse. °Get help right away if: °· You have difficulty breathing. °· You have chest pain. °· You have blood in your urine or stool, or you vomit blood. °Summary °· After the procedure, it is common to have a sore throat or nausea. It is also common to feel tired. °· Have a responsible adult stay with you for the first 24 hours after general anesthesia. It is  important to have someone help care for you until you are awake and alert. °· When you feel hungry, start by eating small amounts of foods that are soft and easy to digest (bland), such as toast. Gradually return to your regular diet. °· Drink enough fluid to keep your urine pale yellow. °· Return to your normal activities as told by your health care provider. Ask your health care provider what activities are safe for you. °This information is not intended to replace advice given to you by your health care provider. Make sure you discuss any questions you have with your health care provider. °Document Released: 03/21/2001 Document Revised: 12/16/2017 Document Reviewed: 07/29/2017 °Elsevier Patient Education © 2020 [employer] ° °

## 2019-12-04 NOTE — Anesthesia Procedure Notes (Signed)
Procedure Name: LMA Insertion Date/Time: 12/04/2019 2:32 PM Performed by: Cameron Ali, CRNA Pre-anesthesia Checklist: Patient identified, Emergency Drugs available, Suction available, Timeout performed and Patient being monitored Patient Re-evaluated:Patient Re-evaluated prior to induction Oxygen Delivery Method: Circle system utilized Preoxygenation: Pre-oxygenation with 100% oxygen Induction Type: IV induction LMA: LMA inserted LMA Size: 3.0 Number of attempts: 1 Placement Confirmation: positive ETCO2 and breath sounds checked- equal and bilateral Tube secured with: Tape Dental Injury: Teeth and Oropharynx as per pre-operative assessment

## 2019-12-05 ENCOUNTER — Encounter: Payer: Self-pay | Admitting: Orthopedic Surgery

## 2020-06-10 ENCOUNTER — Other Ambulatory Visit (INDEPENDENT_AMBULATORY_CARE_PROVIDER_SITE_OTHER): Payer: Self-pay | Admitting: Family Medicine

## 2020-06-10 DIAGNOSIS — E039 Hypothyroidism, unspecified: Secondary | ICD-10-CM

## 2020-06-10 NOTE — Telephone Encounter (Signed)
LVM advising pt that her medication for Levothyroxine was sent into the [facility] pharmacy in [place]. Also let pt know on vm that she is due for a follow up appt with Dr. Nedra Hai.

## 2020-06-10 NOTE — Telephone Encounter (Signed)
Please call and inform pt rx refill request for Levothyroxine received from [facility] in [place] and renewed for #30 only.  Pt needs follow up.  Please inform pt per [place], virtual visits allowed only if pt is located in [facility] area.

## 2020-07-08 ENCOUNTER — Other Ambulatory Visit (INDEPENDENT_AMBULATORY_CARE_PROVIDER_SITE_OTHER): Payer: Self-pay | Admitting: Family Medicine

## 2020-07-08 ENCOUNTER — Telehealth (INDEPENDENT_AMBULATORY_CARE_PROVIDER_SITE_OTHER): Payer: Self-pay | Admitting: Family Medicine

## 2020-07-08 ENCOUNTER — Encounter (INDEPENDENT_AMBULATORY_CARE_PROVIDER_SITE_OTHER): Payer: Self-pay | Admitting: Family Medicine

## 2020-07-08 DIAGNOSIS — E039 Hypothyroidism, unspecified: Secondary | ICD-10-CM

## 2020-07-08 MED ORDER — LEVOTHYROXINE SODIUM 25 MCG PO TABS
25.0000 ug | ORAL_TABLET | Freq: Every day | ORAL | 0 refills | Status: DC
Start: 2020-07-08 — End: 2021-05-27

## 2020-07-08 NOTE — Telephone Encounter (Signed)
I assume this is for the Levothyroxine medication.  Please call and inform pt I will renew for only 90 days.  Please confirm which pharmacy to send rx to.  There are two pharmacies in [place] listed in her chart.

## 2020-07-08 NOTE — Progress Notes (Signed)
This encounter was created in error - please disregard.    Items noted as "reviewed" are for administrative purposes only and are not guaranteed by the provider to be accurate on this date.

## 2020-07-08 NOTE — Telephone Encounter (Signed)
[id number] - [place], [place] - [address]    Pt wanted to know if she can get a referral to go see Dr. Sunday Corn Corsino who is an endocrinologist in [place].

## 2020-07-08 NOTE — Telephone Encounter (Signed)
Patient called stating she is a full time student in [place], and her mom recently moved to [facility] as well. She states she is out of there thyroid medication and would like a temp refill until she can find a new PCP in [place]. She won't be able to do a VV since she is out of state. Please advise if patient can have a 90 day temp refill until she finds a new PCP.

## 2020-07-08 NOTE — Telephone Encounter (Signed)
See below message for medication refill for pt.

## 2020-07-08 NOTE — Telephone Encounter (Signed)
STP and advised her that medication was sent to pharmacy and that for the Endocrin referral she would have to get that from her new PCP in [place] once she finds one.

## 2020-07-08 NOTE — Progress Notes (Signed)
Rx Levothyroxine #90 renewed.

## 2020-07-08 NOTE — Telephone Encounter (Signed)
Rx Levothyroxine #90 sent to [facility].  Please call and inform pt she needs will need to see her new PCP for ENDO referral.

## 2020-09-10 ENCOUNTER — Encounter (INDEPENDENT_AMBULATORY_CARE_PROVIDER_SITE_OTHER): Payer: Self-pay | Admitting: Family Medicine

## 2020-11-12 ENCOUNTER — Other Ambulatory Visit: Payer: Self-pay

## 2020-11-12 ENCOUNTER — Ambulatory Visit
Admission: RE | Admit: 2020-11-12 | Discharge: 2020-11-12 | Disposition: A | Discharge status: Home / Self Care | Payer: BC Managed Care – PPO | Source: Ambulatory Visit | Attending: Emergency Medicine | Admitting: Emergency Medicine

## 2020-11-12 VITALS — BP 116/74 | HR 60 | Temp 98.2°F | Resp 16

## 2020-11-12 DIAGNOSIS — Z113 Encounter for screening for infections with a predominantly sexual mode of transmission: Secondary | ICD-10-CM | POA: Diagnosis not present

## 2020-11-12 DIAGNOSIS — Z79899 Other long term (current) drug therapy: Secondary | ICD-10-CM | POA: Diagnosis not present

## 2020-11-12 DIAGNOSIS — Z3202 Encounter for pregnancy test, result negative: Secondary | ICD-10-CM

## 2020-11-12 DIAGNOSIS — N898 Other specified noninflammatory disorders of vagina: Secondary | ICD-10-CM

## 2020-11-12 LAB — POCT URINALYSIS DIP (MANUAL ENTRY)
Bilirubin, UA: NEGATIVE
Blood, UA: NEGATIVE
Glucose, UA: NEGATIVE mg/dL
Ketones, POC UA: NEGATIVE mg/dL
Nitrite, UA: NEGATIVE
Protein Ur, POC: NEGATIVE mg/dL
Spec Grav, UA: <=1.005 — AB (ref 1.010–1.025)
Urobilinogen, UA: 0.2 E.U./dL
pH, UA: 6 (ref 5.0–8.0)

## 2020-11-12 LAB — POCT URINE PREGNANCY: Preg Test, Ur: NEGATIVE

## 2020-11-12 MED ORDER — FLUCONAZOLE 150 MG PO TABS: 150.0000 mg | ORAL_TABLET | Freq: Every day | ORAL | 0 refills | Status: AC

## 2020-11-12 NOTE — ED Triage Notes (Signed)
Patient presents to Urgent Care with complaints of vaginal itching x 2 day, believe she may have had a reaction to the OTC Monistat after using treatment exp. burning sensation.

## 2020-11-12 NOTE — Discharge Instructions (Signed)
Take the Diflucan as directed.      Your vaginal tests are pending.  If your test results are positive, we will call you.  You and your sexual partner(s) may require treatment at that time.  Do not have sexual activity until the test results are back.    Follow up with your primary care provider or gynecologist if your symptoms are not improving.

## 2020-11-12 NOTE — ED Provider Notes (Signed)
Renaldo Fiddler    CSN: [id number] Arrival date & time: 11/12/20  0950      History   Chief Complaint Chief Complaint  Patient presents with  . Vaginitis    HPI Alexandria Reeves is a 24 y.o. female.   Patient presents with 2-day history of vaginal itching.  She attempted treatment with OTC Monistat; she had burning and redness on her labia after using the Monistat so she washed it off and her symptoms improved.  She denies vaginal discharge, fever, chills, lesions, pelvic pain, abdominal pain, back pain, or other symptoms.  Her medical history includes hypothyroidism and PCOS.  The history is provided by the patient and medical records.    Past Medical History:  Diagnosis Date  . Hypothyroidism   . PCOS (polycystic ovarian syndrome)   . Thyroid disease   . Wisdom teeth extracted     There are no problems to display for this patient.   Past Surgical History:  Procedure Laterality Date  . KNEE ARTHROSCOPY WITH EXCISION PLICA Right 12/04/2019   Procedure: KNEE ARTHROSCOPY WITH MEDIAL EXCISION PLICA, PATELLA CHONDROPLASTY, MACI BIOPSY, MEDIAL MENISCUS REPAIR;  Surgeon: Signa Kell, MD;  Location[facility];  Service: Orthopedics;  Laterality: Right;  UPREG  . WISDOM TOOTH EXTRACTION      OB History   No obstetric history on file.      Home Medications    Prior to Admission medications   Medication Sig Start Date End Date Taking? Authorizing Provider  acetaminophen (TYLENOL) 500 MG tablet Take 2 tablets (1,000 mg total) by mouth every 8 (eight) hours. 12/04/19 12/03/20  Signa Kell, MD  fluconazole (DIFLUCAN) 150 MG tablet Take 1 tablet (150 mg total) by mouth daily. Take one tablet today.  May repeat in 3 days. 11/12/20   Mickie Bail, NP  HYDROcodone-acetaminophen (NORCO) 5-325 MG tablet Take 1-2 tablets by mouth every 4 (four) hours as needed for moderate pain or severe pain. 12/04/19   Signa Kell, MD  levonorgestrel-ethinyl estradiol Williemae Natter)  0.15-30 MG-MCG tablet Take 1 tablet by mouth daily.    [provider]  levothyroxine (SYNTHROID) 25 MCG tablet Take by mouth daily before breakfast.    [provider]  ondansetron (ZOFRAN ODT) 4 MG disintegrating tablet Take 1 tablet (4 mg total) by mouth every 8 (eight) hours as needed for nausea or vomiting. 12/04/19   Signa Kell, MD    Family History Family History  Family history unknown: Yes    Social History Social History   Tobacco Use  . Smoking status: Never Smoker  . Smokeless tobacco: Never Used  Vaping Use  . Vaping Use: Never used  Substance Use Topics  . Alcohol use: Yes    Comment: rarely  . Drug use: Never     Allergies   Patient has no known allergies.   Review of Systems Review of Systems  Constitutional: Negative for chills and fever.  HENT: Negative for ear pain and sore throat.   Eyes: Negative for pain and visual disturbance.  Respiratory: Negative for cough and shortness of breath.   Cardiovascular: Negative for chest pain and palpitations.  Gastrointestinal: Negative for abdominal pain and vomiting.  Genitourinary: Positive for vaginal pain. Negative for dysuria, flank pain, hematuria, pelvic pain and vaginal discharge.  Musculoskeletal: Negative for arthralgias and back pain.  Skin: Negative for color change and rash.  Neurological: Negative for seizures and syncope.  All other systems reviewed and are negative.    Physical Exam  Triage Vital Signs ED Triage Vitals  Enc Vitals Group     BP      Pulse      Resp      Temp      Temp src      SpO2      Weight      Height      Head Circumference      Peak Flow      Pain Score      Pain Loc      Pain Edu?      Excl. in GC?    No data found.  Updated Vital Signs BP 116/74   Pulse 60 Comment: baseline  Temp 98.2 F (36.8 C) (Oral)   Resp 16   LMP 10/29/2020   SpO2 98%   Visual Acuity Right Eye Distance:   Left Eye Distance:   Bilateral Distance:     Right Eye Near:   Left Eye Near:    Bilateral Near:     Physical Exam Vitals and nursing note reviewed.  Constitutional:      General: She is not in acute distress.    Appearance: She is well-developed. She is not ill-appearing.  HENT:     Head: Normocephalic and atraumatic.     Mouth/Throat:     Mouth: Mucous membranes are moist.  Eyes:     Conjunctiva/sclera: Conjunctivae normal.  Cardiovascular:     Rate and Rhythm: Normal rate and regular rhythm.     Heart sounds: No murmur heard.   Pulmonary:     Effort: Pulmonary effort is normal. No respiratory distress.     Breath sounds: Normal breath sounds.  Abdominal:     Palpations: Abdomen is soft.     Tenderness: There is no abdominal tenderness. There is no right CVA tenderness, left CVA tenderness, guarding or rebound.  Genitourinary:    General: Normal vulva.     Rectum: Normal.  Musculoskeletal:     Cervical back: Neck supple.  Skin:    General: Skin is warm and dry.     Findings: No lesion or rash.  Neurological:     General: No focal deficit present.     Mental Status: She is alert and oriented to person, place, and time.     Gait: Gait normal.  Psychiatric:        Mood and Affect: Mood normal.        Behavior: Behavior normal.      UC Treatments / Results  Labs (all labs ordered are listed, but only abnormal results are displayed) Labs Reviewed  POCT URINALYSIS DIP (MANUAL ENTRY) - Abnormal; Notable for the following components:      Result Value   Spec Grav, UA <=1.005 (*)    Leukocytes, UA Small (1+) (*)    All other components within normal limits  URINE CULTURE  POCT URINE PREGNANCY  CERVICOVAGINAL ANCILLARY ONLY    EKG   Radiology No results found.  Procedures Procedures (including critical care time)  Medications Ordered in UC Medications - No data to display  Initial Impression / Assessment and Plan / UC Course  I have reviewed the triage vital signs and the nursing  notes.  Pertinent labs & imaging results that were available during my care of the patient were reviewed by me and considered in my medical decision making (see chart for details).   Vaginal itching and irritation.  Treating with Diflucan.  Vaginal swab obtained for testing.  Instructed patient  to abstain from sexual activity until the test results are back.  Discussed that she and her sexual partner may require treatment at that time.  Instructed her to follow-up with her PCP or OB/GYN if her symptoms are not improving.  Patient agrees to plan of care.   Final Clinical Impressions(s) / UC Diagnoses   Final diagnoses:  Vaginal itching  Vaginal irritation     Discharge Instructions     Take the Diflucan as directed.      Your vaginal tests are pending.  If your test results are positive, we will call you.  You and your sexual partner(s) may require treatment at that time.  Do not have sexual activity until the test results are back.    Follow up with your primary care provider or gynecologist if your symptoms are not improving.          ED Prescriptions    Medication Sig Dispense Auth. Provider   fluconazole (DIFLUCAN) 150 MG tablet Take 1 tablet (150 mg total) by mouth daily. Take one tablet today.  May repeat in 3 days. 2 tablet Mickie Bail, NP     PDMP not reviewed this encounter.   Mickie Bail, NP 11/12/20 1026

## 2020-11-13 LAB — CERVICOVAGINAL ANCILLARY ONLY
Bacterial Vaginitis (gardnerella): NEGATIVE
Candida Glabrata: NEGATIVE
Candida Vaginitis: POSITIVE — AB
Chlamydia: NEGATIVE
Comment: NEGATIVE
Comment: NEGATIVE
Comment: NEGATIVE
Comment: NEGATIVE
Comment: NEGATIVE
Comment: NORMAL
Neisseria Gonorrhea: NEGATIVE
Trichomonas: NEGATIVE

## 2020-11-14 ENCOUNTER — Telehealth (HOSPITAL_COMMUNITY): Payer: Self-pay | Admitting: Emergency Medicine

## 2020-11-14 LAB — URINE CULTURE: Culture: 40000 — AB

## 2020-11-14 MED ORDER — SULFAMETHOXAZOLE-TRIMETHOPRIM 800-160 MG PO TABS: 1.0000 | ORAL_TABLET | Freq: Two times a day (BID) | ORAL | 0 refills | Status: AC

## 2020-12-04 ENCOUNTER — Encounter (INDEPENDENT_AMBULATORY_CARE_PROVIDER_SITE_OTHER): Payer: Self-pay | Admitting: Family Medicine

## 2020-12-04 DIAGNOSIS — R002 Palpitations: Secondary | ICD-10-CM | POA: Insufficient documentation

## 2021-05-27 ENCOUNTER — Encounter (INDEPENDENT_AMBULATORY_CARE_PROVIDER_SITE_OTHER): Payer: Self-pay | Admitting: Family Nurse Practitioner

## 2021-05-27 ENCOUNTER — Ambulatory Visit (INDEPENDENT_AMBULATORY_CARE_PROVIDER_SITE_OTHER): Payer: No Typology Code available for payment source | Admitting: Family Nurse Practitioner

## 2021-05-27 ENCOUNTER — Other Ambulatory Visit (INDEPENDENT_AMBULATORY_CARE_PROVIDER_SITE_OTHER): Payer: Self-pay | Admitting: Family Nurse Practitioner

## 2021-05-27 VITALS — BP 107/64 | HR 58 | Temp 98.0°F | Wt 150.8 lb

## 2021-05-27 DIAGNOSIS — Z760 Encounter for issue of repeat prescription: Secondary | ICD-10-CM

## 2021-05-27 DIAGNOSIS — E282 Polycystic ovarian syndrome: Secondary | ICD-10-CM | POA: Insufficient documentation

## 2021-05-27 DIAGNOSIS — Z7689 Persons encountering health services in other specified circumstances: Secondary | ICD-10-CM

## 2021-05-27 MED ORDER — SPIRONOLACTONE 50 MG PO TABS
50.0000 mg | ORAL_TABLET | Freq: Every day | ORAL | 0 refills | Status: DC
Start: 2021-05-27 — End: 2021-05-27

## 2021-05-27 NOTE — Progress Notes (Signed)
Subjective:      Patient ID: Brenda Dixon is a 25 y.o. female     Chief Complaint   Patient presents with   . Transfer care/HA's        Pt is a 25 year old female with POCS is here to transfer care/refill.  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   Pt stated she saw endocrinologist and she told her that she doesn't need to take levothyroxine.  She need refill on Spironolactone 25 mg             The following sections were reviewed this encounter by the provider:   Tobacco  Allergies  Meds  Problems  Med Hx  Surg Hx  Fam Hx           Review of Systems   All other systems reviewed and are negative.         BP 107/64 (BP Site: Left arm, Patient Position: Sitting, Cuff Size: Medium)   Pulse (!) 58   Temp 98 F (36.7 C) (Temporal)   Wt 68.4 kg (150 lb 12.8 oz)   BMI 24.34 kg/m     Objective:     Physical Exam  Vitals reviewed.   Constitutional:       Appearance: Normal appearance.   Cardiovascular:      Rate and Rhythm: Normal rate and regular rhythm.      Pulses: Normal pulses.      Heart sounds: Normal heart sounds.   Pulmonary:      Effort: Pulmonary effort is normal.      Breath sounds: Normal breath sounds.   Abdominal:      General: Bowel sounds are normal.   Skin:     General: Skin is warm and dry.   Neurological:      Mental Status: She is alert.          Assessment:     1. Polycystic ovary syndrome  - spironolactone (ALDACTONE) 50 MG tablet; Take 1 tablet (50 mg total) by mouth daily  Dispense: 30 tablet; Refill: 0    2. Establishing care with new doctor, encounter for    3. Encounter for medication refill        Plan:     Refill on medications were sent to pharmacy  Pt will make an appointment for physical and fasting blood work.    Alvy Beal, DNP FNP

## 2021-06-04 ENCOUNTER — Encounter (INDEPENDENT_AMBULATORY_CARE_PROVIDER_SITE_OTHER): Payer: Self-pay | Admitting: Family Nurse Practitioner

## 2021-06-04 ENCOUNTER — Other Ambulatory Visit (INDEPENDENT_AMBULATORY_CARE_PROVIDER_SITE_OTHER): Payer: Self-pay | Admitting: Family Nurse Practitioner

## 2021-06-04 ENCOUNTER — Ambulatory Visit (INDEPENDENT_AMBULATORY_CARE_PROVIDER_SITE_OTHER): Payer: No Typology Code available for payment source | Admitting: Family Nurse Practitioner

## 2021-06-04 VITALS — BP 116/74 | HR 60 | Temp 98.3°F | Ht 66.0 in | Wt 149.2 lb

## 2021-06-04 DIAGNOSIS — Z0000 Encounter for general adult medical examination without abnormal findings: Secondary | ICD-10-CM

## 2021-06-04 DIAGNOSIS — E282 Polycystic ovarian syndrome: Secondary | ICD-10-CM

## 2021-06-04 DIAGNOSIS — E559 Vitamin D deficiency, unspecified: Secondary | ICD-10-CM

## 2021-06-04 DIAGNOSIS — Z23 Encounter for immunization: Secondary | ICD-10-CM

## 2021-06-04 LAB — COMPREHENSIVE METABOLIC PANEL
ALT: 19 U/L (ref 0–55)
AST (SGOT): 26 U/L (ref 5–34)
Albumin/Globulin Ratio: 1.4 (ref 0.9–2.2)
Albumin: 3.9 g/dL (ref 3.5–5.0)
Alkaline Phosphatase: 56 U/L (ref 37–117)
Anion Gap: 6 (ref 5.0–15.0)
BUN: 7 mg/dL (ref 7.0–19.0)
Bilirubin, Total: 0.5 mg/dL (ref 0.2–1.2)
CO2: 24 mEq/L (ref 21–29)
Calcium: 8.6 mg/dL (ref 8.5–10.5)
Chloride: 108 mEq/L (ref 100–111)
Creatinine: 0.7 mg/dL (ref 0.4–1.5)
Globulin: 2.8 g/dL (ref 2.0–3.6)
Glucose: 84 mg/dL (ref 70–100)
Potassium: 4.3 mEq/L (ref 3.5–5.1)
Protein, Total: 6.7 g/dL (ref 6.0–8.3)
Sodium: 138 mEq/L (ref 136–145)

## 2021-06-04 LAB — CBC AND DIFFERENTIAL
Absolute NRBC: 0 10*3/uL (ref 0.00–0.00)
Basophils Absolute Automated: 0.04 10*3/uL (ref 0.00–0.08)
Basophils Automated: 0.9 %
Eosinophils Absolute Automated: 0.15 10*3/uL (ref 0.00–0.44)
Eosinophils Automated: 3.2 %
Hematocrit: 39.1 % (ref 34.7–43.7)
Hgb: 12.7 g/dL (ref 11.4–14.8)
Immature Granulocytes Absolute: 0.01 10*3/uL (ref 0.00–0.07)
Immature Granulocytes: 0.2 %
Lymphocytes Absolute Automated: 1.61 10*3/uL (ref 0.42–3.22)
Lymphocytes Automated: 34.5 %
MCH: 29.7 pg (ref 25.1–33.5)
MCHC: 32.5 g/dL (ref 31.5–35.8)
MCV: 91.6 fL (ref 78.0–96.0)
MPV: 10.5 fL (ref 8.9–12.5)
Monocytes Absolute Automated: 0.44 10*3/uL (ref 0.21–0.85)
Monocytes: 9.4 %
Neutrophils Absolute: 2.41 10*3/uL (ref 1.10–6.33)
Neutrophils: 51.8 %
Nucleated RBC: 0 /100 WBC (ref 0.0–0.0)
Platelets: 226 10*3/uL (ref 142–346)
RBC: 4.27 10*6/uL (ref 3.90–5.10)
RDW: 12 % (ref 11–15)
WBC: 4.66 10*3/uL (ref 3.10–9.50)

## 2021-06-04 LAB — TSH: TSH: 1.23 u[IU]/mL (ref 0.35–4.94)

## 2021-06-04 LAB — LIPID PANEL
Cholesterol / HDL Ratio: 2.5
Cholesterol: 120 mg/dL (ref 0–199)
HDL: 48 mg/dL (ref 40–9999)
LDL Calculated: 65 mg/dL (ref 0–99)
Triglycerides: 37 mg/dL (ref 34–149)
VLDL Calculated: 7 mg/dL — ABNORMAL LOW (ref 10–40)

## 2021-06-04 LAB — VITAMIN D,25 OH,TOTAL: Vitamin D, 25 OH, Total: 27 ng/mL — ABNORMAL LOW (ref 30–100)

## 2021-06-04 LAB — GFR: EGFR: >60

## 2021-06-04 LAB — HEMOLYSIS INDEX: Hemolysis Index: 7 (ref 0–24)

## 2021-06-04 MED ORDER — ERGOCALCIFEROL 1.25 MG (50000 UT) PO CAPS
50000.0000 [IU] | ORAL_CAPSULE | ORAL | 1 refills | Status: AC
Start: 2021-06-04 — End: 2021-12-01

## 2021-06-04 MED ORDER — SPIRONOLACTONE 50 MG PO TABS
50.0000 mg | ORAL_TABLET | Freq: Every day | ORAL | 3 refills | Status: DC
Start: 2021-06-04 — End: 2022-09-14

## 2021-06-04 NOTE — Progress Notes (Signed)
[facility]                       Date of Exam: 06/04/2021 7:11 AM        Patient ID: Brenda Dixon is a 25 y.o. female.  Attending Physician: Alvy Beal, DNP FNP        Chief Complaint:    No chief complaint on file.              HPI:    Pt is a 25 year old female with POCS is here for annual physical.  Refill for Spironolactone 25 mg send to pharmacy      Tobacco: no  Alcohol: 1 or less every other month  Drug use: None  Diet: healthy  Exercise: Gym 2-3 X Yoga every day, and walk  Marital status: S, No children  Mood: good  Sleep: not very well   Sun protection: yes  Seatbelts: Yes  Periods: not regular  Contraceptive:  condoms  STD concerns: No  LMP: 05/27/21  PAP: 2021 done in [place]   Tetanus vaccine: done today  Flu vaccine: UTD  Covid vaccines: UTD                      Problem List:    Patient Active Problem List   Diagnosis   . Anxiety   . Depressive disorder   . 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 Fatigue   . Hypothyroidism   . Infectious mononucleosis   . Lactose intolerance   . Migraine   . Tremor   . Dizziness   . Headache   . Nausea   . Near syncope   . Intermittent palpitations   . Polycystic ovary syndrome             Current Meds:    No outpatient medications have been marked as taking for the 06/04/21 encounter (Appointment) with Alvy Beal, DNP FNP.          Allergies:    No Known Allergies          Past Surgical History:    Past Surgical History:   Procedure Laterality Date   . MULTIPLE TOOTH EXTRACTIONS  2016           Family History:    Family History   Problem Relation Age of Onset   . Hypertension Maternal Grandmother    . Hyperlipidemia Maternal Grandmother    . Diabetes Maternal Grandmother    . Diabetes Maternal Grandfather    . Hyperlipidemia Maternal Grandfather    . Hypertension Maternal Grandfather    . Arthritis Paternal Grandmother    . Depression Paternal Grandmother    . Diabetes Paternal Grandmother    . Myocardial  Infarction Paternal Grandmother    . Alcohol abuse Paternal Grandfather            Social History:    Social History     Tobacco Use   . Smoking status: Never Smoker   . Smokeless tobacco: Never Used   Vaping Use   . Vaping Use: Never used   Substance Use Topics   . Alcohol use: No   . Drug use: No           The following sections were reviewed this encounter by the provider:              Vital Signs:  There were no vitals taken for this visit.         ROS:    Review of Systems   All other systems reviewed and are negative.             Physical Exam:    Physical Exam  Vitals reviewed.   Constitutional:       Appearance: Normal appearance.   HENT:      Head: Atraumatic.      Right Ear: Tympanic membrane normal.      Left Ear: Tympanic membrane normal.      Nose: Nose normal.      Mouth/Throat:      Mouth: Mucous membranes are moist.   Eyes:      Pupils: Pupils are equal, round, and reactive to light.   Cardiovascular:      Rate and Rhythm: Normal rate and regular rhythm.      Pulses: Normal pulses.      Heart sounds: Normal heart sounds.   Pulmonary:      Effort: Pulmonary effort is normal.      Breath sounds: Normal breath sounds.   Abdominal:      General: Abdomen is flat. Bowel sounds are normal.   Musculoskeletal:         General: Normal range of motion.   Skin:     General: Skin is warm and dry.   Neurological:      Mental Status: She is alert.   Psychiatric:         Mood and Affect: Mood normal.         Behavior: Behavior normal.              Assessment:    There are no diagnoses linked to this encounter.          Plan:    Health Maintenance:    1. Healthy well woman exam.  Discussed healthy lifestyle with diet and exercise to maintain a healthy weight.  Discussed age appropriate screening and vaccines.  All questions answered.            Follow-up:    No follow-ups on file.         Alvy Beal, DNP FNP

## 2021-06-05 ENCOUNTER — Encounter (INDEPENDENT_AMBULATORY_CARE_PROVIDER_SITE_OTHER): Payer: Self-pay | Admitting: Family Nurse Practitioner

## 2021-06-05 ENCOUNTER — Ambulatory Visit (INDEPENDENT_AMBULATORY_CARE_PROVIDER_SITE_OTHER): Payer: No Typology Code available for payment source | Admitting: Family Nurse Practitioner

## 2021-06-05 VITALS — BP 117/57 | HR 52 | Temp 98.0°F | Wt 149.0 lb

## 2021-06-05 DIAGNOSIS — Z23 Encounter for immunization: Secondary | ICD-10-CM

## 2021-06-05 DIAGNOSIS — F909 Attention-deficit hyperactivity disorder, unspecified type: Secondary | ICD-10-CM

## 2021-06-05 DIAGNOSIS — F419 Anxiety disorder, unspecified: Secondary | ICD-10-CM

## 2021-06-05 MED ORDER — HYDROXYZINE HCL 10 MG PO TABS
10.0000 mg | ORAL_TABLET | Freq: Three times a day (TID) | ORAL | 0 refills | Status: AC | PRN
Start: 2021-06-05 — End: ?

## 2021-06-05 MED ORDER — AMPHETAMINE-DEXTROAMPHETAMINE 5 MG PO TABS
5.0000 mg | ORAL_TABLET | Freq: Every day | ORAL | 0 refills | Status: DC
Start: 2021-06-05 — End: 2021-06-05

## 2021-06-05 NOTE — Progress Notes (Signed)
Subjective:      Patient ID: Brenda Dixon is a 25 y.o. female     No chief complaint on file.           Pt is a 25 year old female medical condition including POCS, migraine, and anxiety/depression is here for ADHD evaluation.   She wants to be evaluated for ADHD. She stated that she learned how to get herself on a schedule. She forces herself to finish all her tasks, but she gets very anxious and irritated if she can do it.   Despite of her high score for ADHD she doesn't want medication currently.  She will try some anxiety medication and see how she does.    Adult ADHD-RS-IV self-assessment:    1. Carelessness       score 13  2. Difficulty sustaining attention in activities   score 18  3. Doesn't listen      score 09   4. No follow through     score 15  5. Can't organize      score 11  6. Avoids/dislike tasks requiring sustain mental effort   score 08  7. Loses important items                          score 12  8. Easily distractible     score 11  9. Forgetful in daily activities     score 07  10. Squirms & fidgets     score 12  11. Can't stay seated     score 21  12. Runs/climbs excessively    score 09  13. Can't work/play quietly     score 09  14. On the go, "driven by motor"    score 10  15. Talks excessively     score 09  16. Blurts out answers     score 11  17. Can't wait for turn     score 04  18. Intrudes/interrupts others     score 08    Total score is 202    The total score of 202 indicates adult ADHD.            The following sections were reviewed this encounter by the provider:   Tobacco  Allergies  Meds  Problems  Med Hx  Surg Hx  Fam Hx           Review of Systems   All other systems reviewed and are negative.         BP 117/57 (BP Site: Right arm, Patient Position: Sitting, Cuff Size: Medium)   Pulse (!) 52   Temp 98 F (36.7 C) (Tympanic)   Wt 67.6 kg (149 lb)   BMI 24.05 kg/m     Objective:     Physical Exam  Vitals reviewed.   Constitutional:       Appearance: Normal  appearance.   Cardiovascular:      Rate and Rhythm: Normal rate and regular rhythm.      Pulses: Normal pulses.      Heart sounds: Normal heart sounds.   Pulmonary:      Effort: Pulmonary effort is normal.      Breath sounds: Normal breath sounds.   Abdominal:      General: Bowel sounds are normal.   Skin:     General: Skin is warm and dry.   Neurological:      Mental Status: She is alert.  Assessment:     1. Need for Tdap vaccination  - Tdap vaccine greater than or equal to 7yo IM    2. Attention deficit hyperactivity disorder (ADHD), unspecified ADHD type    3. Anxiety  - hydrOXYzine (ATARAX) 10 MG tablet; Take 1 tablet (10 mg total) by mouth every 8 (eight) hours as needed for Anxiety  Dispense: 30 tablet; Refill: 0        Plan:     Received Tdap today  Hydroxyzine 10 mg po tid prn prn anxiety  ADHD- pt decided not to take medication for ADHD at this time.      Alvy Beal, DNP FNP

## 2021-06-22 IMAGING — MR MRI OF THE RIGHT KNEE WITHOUT CONTRAST
7 series · 40 of 40 positions shown · non-contrast
Comparison: None.

CLINICAL DATA: Right knee pain medial to the patella for 1 week. No
recent injury.

EXAM:
MRI OF THE RIGHT KNEE WITHOUT CONTRAST
TECHNIQUE: Multiplanar, multisequence MR imaging of the knee was performed. No
intravenous contrast was administered.

[Series 8: T2 fat-sat · axial · right · 4.0mm · 0.50mm/px · z∈[-56,+67]mm · 5 of 26 slices shown (1 of 3)]
[im 1/26]
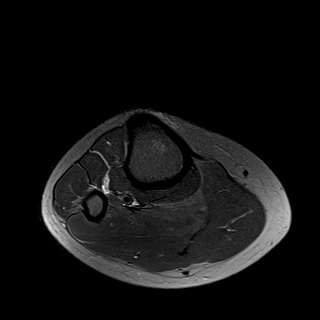
[im 7/26]
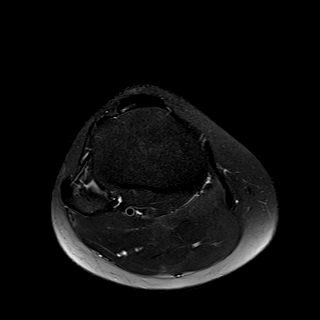
[im 13/26]
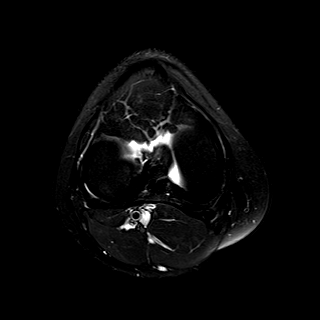
[im 19/26]
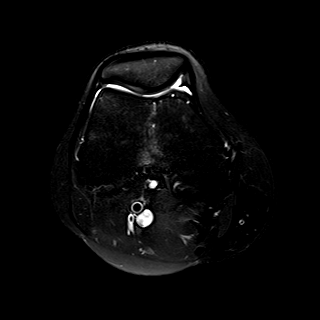
[im 26/26]
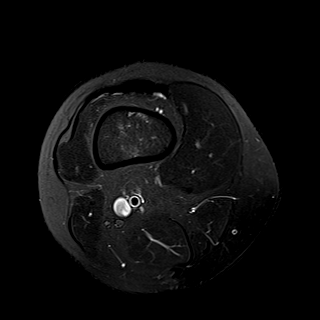

[Series 9: T2 fat-sat · coronal · right · 4.0mm · 0.59mm/px · 7 of 29 slices shown (2 of 3)]
[im 1/29]
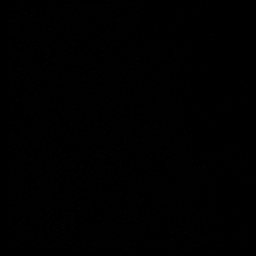
[im 5/29]
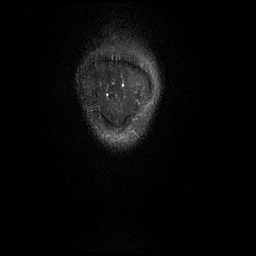
[im 10/29]
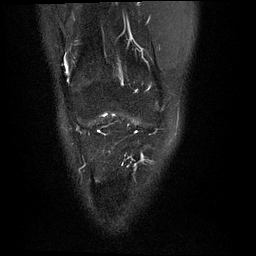
[im 15/29]
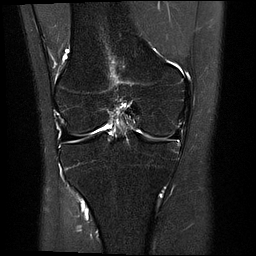
[im 19/29]
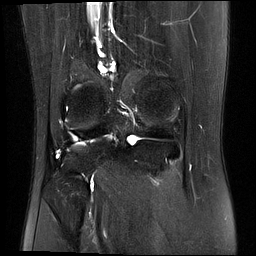
[im 24/29]
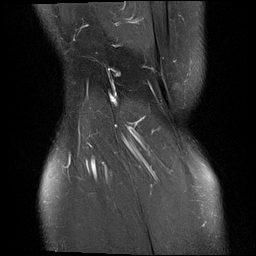
[im 29/29]
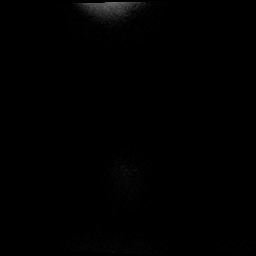

[Series 10: T1 · coronal · right · 4.0mm · 0.59mm/px · 7 of 30 slices shown]
[im 1/30]
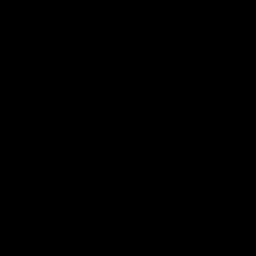
[im 5/30]
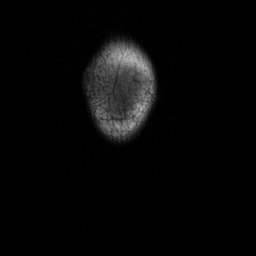
[im 10/30]
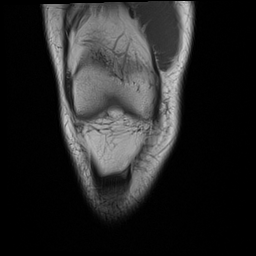
[im 15/30]
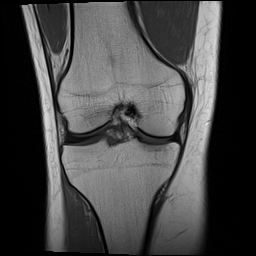
[im 20/30]
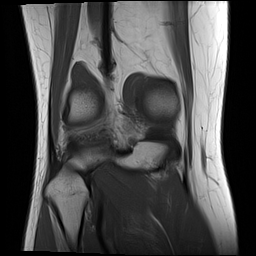
[im 25/30]
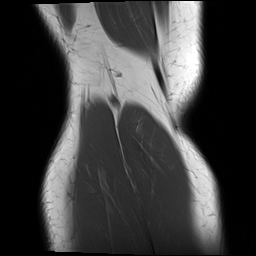
[im 30/30]
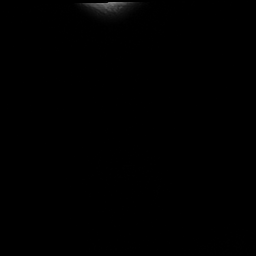

[Series 11: PD fat-sat · coronal · right · 4.0mm · 0.59mm/px · 7 of 30 slices shown (1 of 2)]
[im 1/30]
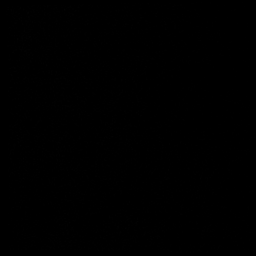
[im 5/30]
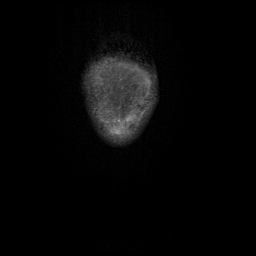
[im 10/30]
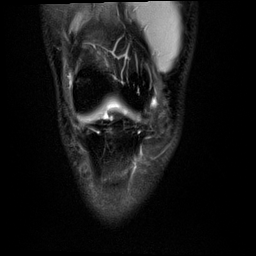
[im 15/30]
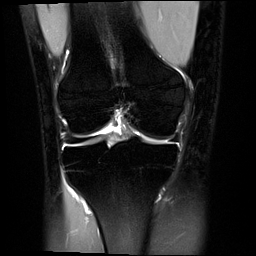
[im 20/30]
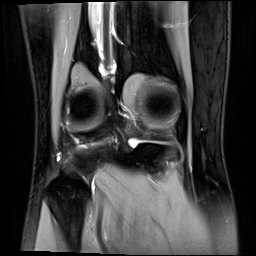
[im 25/30]
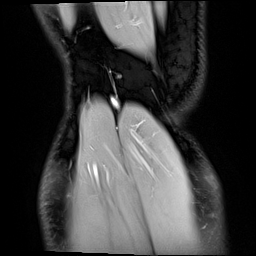
[im 30/30]
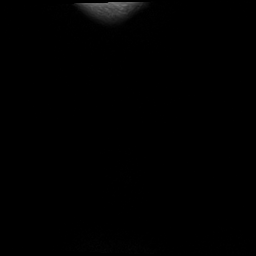

[Series 12: PD fat-sat · sagittal · right · 3.0mm · 0.59mm/px · 6 of 26 slices shown (2 of 2)]
[im 1/26]
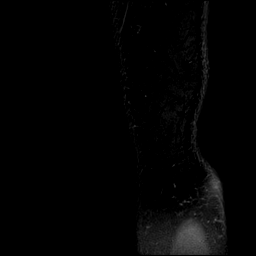
[im 6/26]
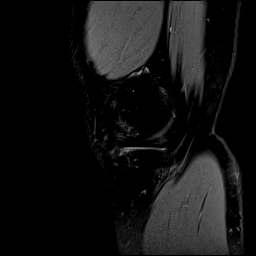
[im 11/26]
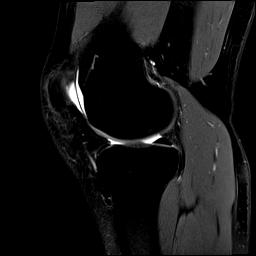
[im 16/26]
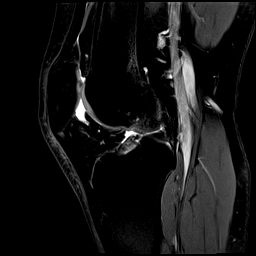
[im 21/26]
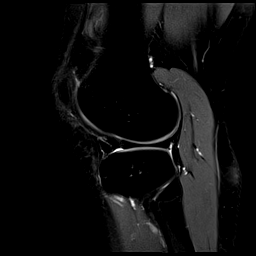
[im 26/26]
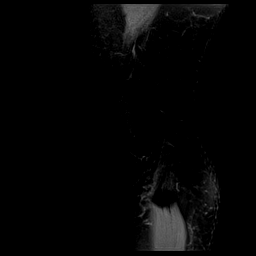

[Series 13: T2 fat-sat · sagittal · right · 3.0mm · 0.59mm/px · 6 of 26 slices shown (3 of 3)]
[im 1/26]
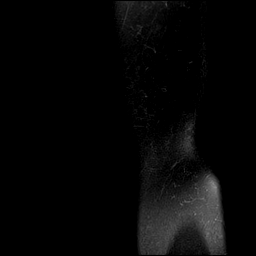
[im 6/26]
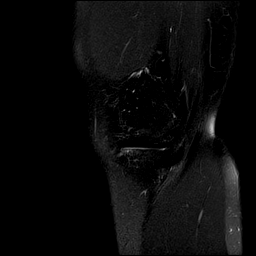
[im 11/26]
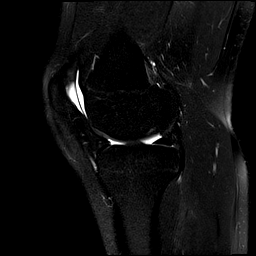
[im 16/26]
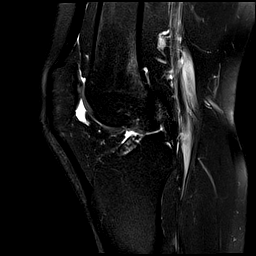
[im 21/26]
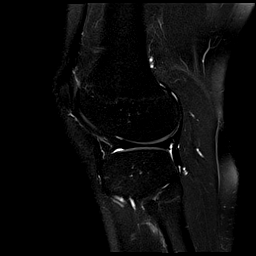
[im 26/26]
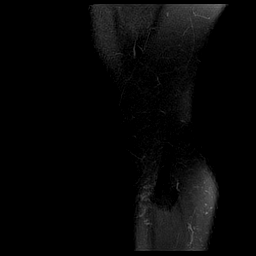

[Series 14: PD · oblique · right · 2.0mm · 0.47mm/px · 2 of 10 slices shown]
[im 1/10]
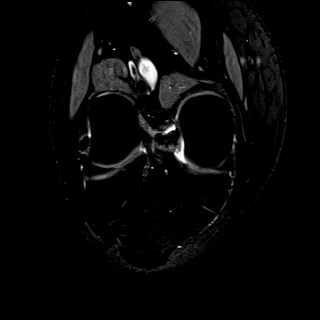
[im 10/10]
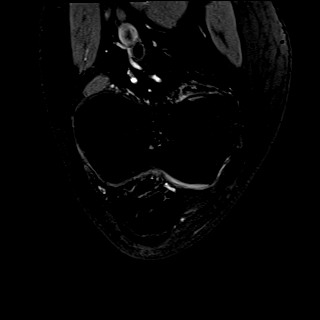

[40 of 40 positions shown; findings below may reference images not displayed]

FINDINGS: MENISCI

Medial meniscus:  Intact.

Lateral meniscus:  Intact.

LIGAMENTS

Cruciates:  Intact.

Collaterals:  Intact.

CARTILAGE

Patellofemoral:  Normal.

Medial:  Normal.

Lateral:  Normal.

Joint:  Trace amount of joint fluid.

Popliteal Fossa:  No Baker's cyst.

Extensor Mechanism:  Intact.

Bones:  Normal marrow signal throughout.

Other: None.
IMPRESSION: Normal MRI right knee.

## 2021-07-16 ENCOUNTER — Encounter (INDEPENDENT_AMBULATORY_CARE_PROVIDER_SITE_OTHER): Payer: Self-pay | Admitting: Family Nurse Practitioner

## 2022-04-08 ENCOUNTER — Encounter (INDEPENDENT_AMBULATORY_CARE_PROVIDER_SITE_OTHER): Payer: Self-pay | Admitting: Family Nurse Practitioner

## 2022-05-07 ENCOUNTER — Encounter (INDEPENDENT_AMBULATORY_CARE_PROVIDER_SITE_OTHER): Payer: Self-pay | Admitting: Family Medicine

## 2022-05-07 ENCOUNTER — Ambulatory Visit (INDEPENDENT_AMBULATORY_CARE_PROVIDER_SITE_OTHER): Payer: No Typology Code available for payment source | Admitting: Family Medicine

## 2022-05-07 VITALS — BP 114/73 | HR 55 | Temp 98.6°F | Ht 66.0 in | Wt 150.8 lb

## 2022-05-07 DIAGNOSIS — R195 Other fecal abnormalities: Secondary | ICD-10-CM

## 2022-05-07 LAB — COMPREHENSIVE METABOLIC PANEL
ALT: 9 U/L (ref 0–55)
AST (SGOT): 25 U/L (ref 5–41)
Albumin/Globulin Ratio: 1.3 (ref 0.9–2.2)
Albumin: 4 g/dL (ref 3.5–5.0)
Alkaline Phosphatase: 58 U/L (ref 37–117)
Anion Gap: 8 (ref 5.0–15.0)
BUN: 8 mg/dL (ref 7.0–21.0)
Bilirubin, Total: 0.3 mg/dL (ref 0.2–1.2)
CO2: 23 mEq/L (ref 17–29)
Calcium: 9.2 mg/dL (ref 8.5–10.5)
Chloride: 108 mEq/L (ref 99–111)
Creatinine: 0.7 mg/dL (ref 0.4–1.0)
Globulin: 3.1 g/dL (ref 2.0–3.6)
Glucose: 81 mg/dL (ref 70–100)
Potassium: 4.3 mEq/L (ref 3.5–5.3)
Protein, Total: 7.1 g/dL (ref 6.0–8.3)
Sodium: 139 mEq/L (ref 135–145)
eGFR: >60 mL/min/{1.73_m2} (ref >=60)

## 2022-05-07 LAB — CBC AND DIFFERENTIAL
Absolute NRBC: 0 10*3/uL (ref 0.00–0.00)
Basophils Absolute Automated: 0.07 10*3/uL (ref 0.00–0.08)
Basophils Automated: 1.1 %
Eosinophils Absolute Automated: 0.15 10*3/uL (ref 0.00–0.44)
Eosinophils Automated: 2.4 %
Hematocrit: 39.8 % (ref 34.7–43.7)
Hgb: 13 g/dL (ref 11.4–14.8)
Immature Granulocytes Absolute: 0.01 10*3/uL (ref 0.00–0.07)
Immature Granulocytes: 0.2 %
Instrument Absolute Neutrophil Count: 2.6 10*3/uL (ref 1.10–6.33)
Lymphocytes Absolute Automated: 2.79 10*3/uL (ref 0.42–3.22)
Lymphocytes Automated: 44.3 %
MCH: 29.7 pg (ref 25.1–33.5)
MCHC: 32.7 g/dL (ref 31.5–35.8)
MCV: 90.9 fL (ref 78.0–96.0)
MPV: 10.6 fL (ref 8.9–12.5)
Monocytes Absolute Automated: 0.68 10*3/uL (ref 0.21–0.85)
Monocytes: 10.8 %
Neutrophils Absolute: 2.6 10*3/uL (ref 1.10–6.33)
Neutrophils: 41.2 %
Nucleated RBC: 0 /100 WBC (ref 0.0–0.0)
Platelets: 262 10*3/uL (ref 142–346)
RBC: 4.38 10*6/uL (ref 3.90–5.10)
RDW: 13 % (ref 11–15)
WBC: 6.3 10*3/uL (ref 3.10–9.50)

## 2022-05-07 LAB — C-REACTIVE PROTEIN: C-Reactive Protein: 0.2 mg/dL (ref 0.0–1.1)

## 2022-05-07 LAB — HEMOLYSIS INDEX: Hemolysis Index: 7 Index (ref 0–24)

## 2022-05-07 NOTE — Patient Instructions (Addendum)
Probiotics for IBS  Align  Jamieson Probiotics  Tuzen  Visbiome      Peppermint oil is derived from the peppermint plant -- a cross between water mint and spearmint -- that thrives in [place].    Peppermint oil is commonly used as flavoring in foods and beverages and as a fragrance in soaps and cosmetics. Peppermint oil also is used for a variety of health conditions and can be taken orally in dietary supplements or topically as a skin cream or ointment.    Clinical evidence suggests that peppermint oil likely can help with symptoms of irritable bowel syndrome. It may also help indigestion and prevent spasms in the GI tract caused by endoscopy or barium enema. Some studies show that used topically it may help soothe tension headaches and cracked nipples from breastfeeding--but more research is needed to confirm these studies.    When used as directed, dietary supplements and skin preparations containing peppermint oil are likely safe for most adults.    Peppermint oil may cause side effects such as heartburn and it may interact with certain medications. Talk to your health care provider before using peppermint oil.    Medicinal Uses of Peppermint Oil  In dietary supplements, peppermint oil has been tried for a variety of digestive problems including:      Irritable bowel syndrome  Indigestion  Heartburn  Dietary supplements containing peppermint oil are also used by some people for the following conditions, although there is no clear evidence that they are helpful:    Nausea  Vomiting  Morning sickness  Cramps of the upper gastrointestinal tract and bile ducts  Diarrhea  Gas  Colds  Coughs  Inflammation of the mouth and throat  Sinus and respiratory infections  Menstrual problems    Skin preparations containing peppermint oil are used by some people for the following conditions, although, again, there is no clear evidence that they are helpful:    Headache  Muscle pain  Nerve  pain  Toothache  Inflammation of the mouth  Joint conditions  Itchiness  Allergic rash  Repelling mosquitoes  In addition, peppermint oil vapor is sometimes inhaled to treat symptoms of colds and coughs. Also, some doctors add peppermint oil to a barium solution to relax the colon during barium enemas.    RELATED      Benefits of Peppermint Oil  Several studies suggest that enteric-coated peppermint oil capsules -- which allow the oil to pass through the stomach so it can dissolve in the intestines -- may help relieve common symptoms of irritable bowel syndrome such as abdominal pain, bloating, and gas. Non-enteric coated forms of peppermint oil, however, actually may cause or worsen heartburn and nausea.    Preliminary studies also suggest that dietary supplements containing a combination of peppermint oil and caraway oil may help relieve indigestion.         Side Effects of Peppermint Oil  In most adults, the small doses of peppermint oil contained in dietary supplements and skin preparations appear to be safe. Pregnant and breastfeeding women, however, should avoid such products because little is known about their safety during pregnancy and lactation.    Possible side effects of peppermint oil include:    Heartburn  Allergic reactions such as flushing, headache, and mouth sores  Anal burning during bouts of diarrhea    Although enteric-coated peppermint oil capsules may reduce the risk of heartburn, their protective coating can break down more quickly and increase the risk  of heartburn when taken at the same time as prescription and over-the-counter medications that decrease stomach acid and which are often used for heartburn or acid reflux. It's best to take such drugs at least two hours after taking enteric-coated peppermint oil products. A stomach condition called achlorhydria, in which the stomach doesn't produce hydrochloric acid, also may hasten the coating's breakdown. So people with the condition are  advised against using peppermint oil.         Possible Drug Interactions With Peppermint Oil  Before taking peppermint oil, discuss the risks and benefits with your health care provider. Some supplements can interact with medicine. Interactions can be harmful or make medications not work as they should.    Be cautious about combining peppermint oil with certain drugs because it may inhibit the body's ability to metabolize the drugs and increase the risk of side effects.

## 2022-05-07 NOTE — Progress Notes (Signed)
rSubjective:      Patient ID: Brenda Dixon is a 26 y.o. female.    Chief Complaint:  Chief Complaint   Patient presents with    Establish Care     Patient wants to discuss stomach issues/aches, excessive loose diarrhea, no change in lifestyle or diet          HPI:  Patient has noticed for the last few weeks that she has had loose stool. She has not changed her eating habits. She always drinks plenty of water. She has started working out more frequently and running nearly daily. She has bowel movements twice daily. She has always had weird bowel movements. Previously she would go more often when working out more frequently. She does have a very sensitive stomach. Gets bloated when she drinks alcohol. The symptoms are not severe. She does not have any abdominal pain associated with this Sometimes she has to go back right after she uses the restroom. No blood in stool. No weight loss. She has a history of restrictive eating so tries not to weigh herself. Thinks stool is [place] 7.      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)    Fatigue    Hypothyroidism    Infectious mononucleosis    Lactose intolerance    Migraine    Tremor    Dizziness    Headache    Nausea    Near syncope    Intermittent palpitations    PCOS (polycystic ovarian syndrome)       Current Medications:  Current Outpatient Medications   Medication Sig Dispense Refill    Vienva 0.1-20 MG-MCG per tablet Take 1 tablet by mouth daily      hydrOXYzine (ATARAX) 10 MG tablet Take 1 tablet (10 mg total) by mouth every 8 (eight) hours as needed for Anxiety (Patient not taking: Reported on 05/07/2022) 30 tablet 0    spironolactone (ALDACTONE) 50 MG tablet Take 1 tablet (50 mg total) by mouth daily (Patient not taking: Reported on 05/07/2022) 90 tablet 3     No current facility-administered medications for this visit.       Allergies:  No Known Allergies    Past Medical  History:  History reviewed. No pertinent past medical history.    Past Surgical History:  Past Surgical History:   Procedure Laterality Date    MULTIPLE TOOTH EXTRACTIONS  2016       Family History:  Family History   Problem Relation Age of Onset    Hypertension Maternal Grandmother     Hyperlipidemia Maternal Grandmother     Diabetes Maternal Grandmother     Diabetes Maternal Grandfather     Hyperlipidemia Maternal Grandfather     Hypertension Maternal Grandfather     Arthritis Paternal Grandmother     Depression Paternal Grandmother     Diabetes Paternal Grandmother     Myocardial Infarction Paternal Grandmother     Alcohol abuse Paternal Grandfather        Social History:  Social History     Socioeconomic History    Marital status: Single   Tobacco Use    Smoking status: Never    Smokeless tobacco: Never   Vaping Use    Vaping status: Never Used   Substance and Sexual Activity    Alcohol use: Yes     Comment: OCC  Drug use: No         Vitals:  BP 114/73 (BP Site: Right arm, Patient Position: Sitting, Cuff Size: Medium)   Pulse (!) 55   Temp 98.6 F (37 C) (Temporal)   Ht 1.676 m (5\' 6" )   Wt 68.4 kg (150 lb 12.8 oz)   BMI 24.34 kg/m      Objective:     Physical Exam:  Physical Exam  Constitutional:       Appearance: Normal appearance.   Cardiovascular:      Heart sounds: Normal heart sounds. No murmur heard.     No friction rub. No gallop.   Pulmonary:      Effort: Pulmonary effort is normal. No respiratory distress.      Breath sounds: Normal breath sounds. No wheezing or rales.   Abdominal:      General: Abdomen is flat. There is no distension.      Palpations: Abdomen is soft.      Tenderness: There is no abdominal tenderness.   Neurological:      Mental Status: She is alert.          Assessment:     1. Loose stools  - Stool Calprotectin Immunoassay; Future  - Stool Cryptosporidium / Giardia Antigen; Future  - C Reactive Protein  - CBC and differential  - Comprehensive metabolic panel  - Tissue  Transglutaminase Antibody Panel; Future  - Tissue Transglutaminase Antibody Panel      Plan:     Check labs as above  Recommend peppermint oil and probiotics  If abnormal refer to GI, if normal follow up with me and discuss management    Nila Nephew, MD

## 2022-05-13 LAB — TISSUE TRANSGLUTAMINASE ANTIBODY PANEL
Tissue Transglutaminase AB, IgG: <1 U/mL (ref <15.0)
Tissue Transglutaminase Antibody, IgA: <1 U/mL (ref <15.0)

## 2022-05-14 ENCOUNTER — Encounter (INDEPENDENT_AMBULATORY_CARE_PROVIDER_SITE_OTHER): Payer: Self-pay | Admitting: Family Medicine

## 2022-09-14 ENCOUNTER — Ambulatory Visit (INDEPENDENT_AMBULATORY_CARE_PROVIDER_SITE_OTHER): Payer: No Typology Code available for payment source

## 2022-09-14 ENCOUNTER — Encounter (INDEPENDENT_AMBULATORY_CARE_PROVIDER_SITE_OTHER): Payer: Self-pay

## 2022-09-14 VITALS — BP 115/64 | HR 52 | Temp 98.4°F | Wt 156.0 lb

## 2022-09-14 DIAGNOSIS — B009 Herpesviral infection, unspecified: Secondary | ICD-10-CM

## 2022-09-14 MED ORDER — VALACYCLOVIR HCL 1 G PO TABS
ORAL_TABLET | ORAL | 1 refills | Status: DC
Start: 2022-09-14 — End: 2023-03-27

## 2022-09-14 NOTE — Progress Notes (Signed)
[facility] [place] [facility]                       Date of Exam: 09/14/2022 2:16 PM        Patient ID: Brenda Dixon is a 26 y.o. female.  Attending Physician: Dorena Bodo, FNP        Chief Complaint:    Chief Complaint   Patient presents with    Mouth Lesions     Started today               HPI:    -Patient presents with cold sore to L crease of mouth. Has tried Abreva cream in past with previous cold sores.  Would eventually clear up. Has also tried an antiviral at college, had helped clear cold sores.     -Of note patient had a cold x1 week ago, lasted four days. Also ran marathon over weekend, overall feels "run down".    Mouth Lesions   The current episode started today. The onset was sudden. The problem occurs occasionally. The problem has been unchanged. The problem is mild. Associated symptoms include mouth sores.             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)    Hypothyroidism    Infectious mononucleosis    Lactose intolerance    Migraine    Tremor    Dizziness    Headache    Nausea    Near syncope    Intermittent palpitations    PCOS (polycystic ovarian syndrome)    Herpes simplex             Current Meds:    Outpatient Medications Marked as Taking for the 09/14/22 encounter (Office Visit) with Dorena Bodo, FNP   Medication Sig Dispense Refill    drospirenone-ethinyl estradiol (YAZ) 3-0.02 MG per tablet Take 1 tablet by mouth daily      hydrOXYzine (ATARAX) 10 MG tablet Take 1 tablet (10 mg total) by mouth every 8 (eight) hours as needed for Anxiety 30 tablet 0    tretinoin (RETIN-A) 0.05 % cream             Allergies:    No Known Allergies          Past Surgical History:    Past Surgical History:   Procedure Laterality Date    MULTIPLE TOOTH EXTRACTIONS  2016           Family History:    Family History   Problem Relation Age of Onset     Hypertension Maternal Grandmother     Hyperlipidemia Maternal Grandmother     Diabetes Maternal Grandmother     Diabetes Maternal Grandfather     Hyperlipidemia Maternal Grandfather     Hypertension Maternal Grandfather     Arthritis Paternal Grandmother     Depression Paternal Grandmother     Diabetes Paternal Grandmother     Myocardial Infarction Paternal Grandmother     Alcohol abuse Paternal Grandfather            Social History:    Social History     Tobacco Use    Smoking status: Never    Smokeless tobacco: Never   Vaping Use    Vaping Use: Never used   Substance Use Topics  Alcohol use: Yes     Comment: OCC    Drug use: No           The following sections were reviewed this encounter by the provider:   Tobacco  Allergies  Meds  Problems  Med Hx  Surg Hx  Fam Hx             Vital Signs:    BP 115/64 (BP Site: Left arm, Patient Position: Sitting, Cuff Size: Medium)   Pulse (!) 52   Temp 98.4 F (36.9 C) (Tympanic)   Wt 70.8 kg (156 lb)   BMI 25.18 kg/m          ROS:    As per HPI          Physical Exam:    Physical Exam  HENT:      Head: Normocephalic and atraumatic.      Right Ear: Tympanic membrane, ear canal and external ear normal.      Left Ear: Tympanic membrane, ear canal and external ear normal.      Nose: Nose normal.      Mouth/Throat:      Mouth: Mucous membranes are moist.      Pharynx: Posterior oropharyngeal erythema present.      Comments: Vesicles x2 to L oral crease  Eyes:      Conjunctiva/sclera: Conjunctivae normal.      Pupils: Pupils are equal, round, and reactive to light.   Cardiovascular:      Rate and Rhythm: Normal rate and regular rhythm.      Pulses: Normal pulses.      Heart sounds: Normal heart sounds.   Pulmonary:      Effort: Pulmonary effort is normal.      Breath sounds: Normal breath sounds.   Musculoskeletal:         General: Normal range of motion.      Cervical back: Normal range of motion and neck supple.   Skin:     General: Skin is warm.      Capillary  Refill: Capillary refill takes less than 2 seconds.      Findings: Erythema and lesion present.      Comments: L oral crease vesicles x2   Neurological:      General: No focal deficit present.      Mental Status: She is alert and oriented to person, place, and time. Mental status is at baseline.              Assessment:    1. Herpes simplex  - valACYclovir (VALTREX) 1000 MG tablet; Take 2 tablets twice a day for one day for cold sore outbreak.  Dispense: 20 tablet; Refill: 1            Plan:    -Antiviral medication sent for cold sore relief. Refill sent if future breakouts occur.             Follow-up:    Return if symptoms worsen or fail to improve.         Dorena Bodo, FNP

## 2022-09-21 ENCOUNTER — Encounter (INDEPENDENT_AMBULATORY_CARE_PROVIDER_SITE_OTHER): Payer: Self-pay | Admitting: Family Medicine

## 2022-09-21 ENCOUNTER — Ambulatory Visit (INDEPENDENT_AMBULATORY_CARE_PROVIDER_SITE_OTHER): Payer: No Typology Code available for payment source | Admitting: Family Medicine

## 2022-09-21 VITALS — BP 101/63 | HR 54 | Temp 97.7°F | Ht 66.0 in | Wt 156.0 lb

## 2022-09-21 DIAGNOSIS — M545 Low back pain, unspecified: Secondary | ICD-10-CM

## 2022-09-21 NOTE — Progress Notes (Signed)
[facility] [place] [facility]                       Date of Exam: 09/21/2022 5:17 PM        Patient ID: Brenda Dixon is a 26 y.o. female.  Attending Physician: Nila NephewKathryn K Sayyid Harewood, MD        Chief Complaint:    Chief Complaint   Patient presents with    Lower Back Pain      Noticed 1 week ago. Pt denies any known trauma/injury - Ran 1/2 marathon on 9 days ago                HPI:    Patient ran a half marathon 9 days ago. She was sore the day after. She started to notice the soreness subsiding. She noticed the back pain in her upper back resolving, but the pain in the lower back felt like a bruise and it seems to keep getting worse. The pain is dull. She has not done any running or exercising since then. She tried to mimic a run a few days ago and it flared up. It also feels numb when she touches it which made her nervous. She has not foam rolled because she was not sure if that would make it worse. Training going into the half marathon was intense for her, she started training in May. She ran three to four times a week and her long runs were ten miles on the weekends, during the week runs were 3-5 miles. She was doing weight training as well. Closer to her run when she would do more ten milers. The parts of her body that did hurt while running were the lower back. That only happened that one time.               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)    Hypothyroidism    Infectious mononucleosis    Lactose intolerance    Migraine    Tremor    Dizziness    Headache    Nausea    Near syncope    Intermittent palpitations    PCOS (polycystic ovarian syndrome)    Herpes simplex             Current Meds:    Outpatient Medications Marked as Taking for the 09/21/22 encounter (Office Visit) with Nila NephewMorcom, Madason Rauls K, MD   Medication Sig Dispense Refill    drospirenone-ethinyl estradiol (YAZ) 3-0.02 MG  per tablet Take 1 tablet by mouth daily      hydrOXYzine (ATARAX) 10 MG tablet Take 1 tablet (10 mg total) by mouth every 8 (eight) hours as needed for Anxiety 30 tablet 0    tretinoin (RETIN-A) 0.05 % cream       valACYclovir (VALTREX) 1000 MG tablet Take 2 tablets twice a day for one day for cold sore outbreak. 20 tablet 1          Allergies:    No Known Allergies          Past Surgical History:    Past Surgical History:   Procedure Laterality Date    MULTIPLE TOOTH EXTRACTIONS  2016           Family History:    Family History   Problem Relation Age of Onset  Hypertension Maternal Grandmother     Hyperlipidemia Maternal Grandmother     Diabetes Maternal Grandmother     Diabetes Maternal Grandfather     Hyperlipidemia Maternal Grandfather     Hypertension Maternal Grandfather     Arthritis Paternal Grandmother     Depression Paternal Grandmother     Diabetes Paternal Grandmother     Myocardial Infarction Paternal Grandmother     Alcohol abuse Paternal Grandfather            Social History:    Social History     Tobacco Use    Smoking status: Never    Smokeless tobacco: Never   Vaping Use    Vaping Use: Never used   Substance Use Topics    Alcohol use: Yes     Comment: OCC    Drug use: No           The following sections were reviewed this encounter by the provider:            Vital Signs:    BP 101/63 (BP Site: Left arm, Patient Position: Sitting, Cuff Size: Medium)   Pulse (!) 54   Temp 97.7 F (36.5 C)   Ht 1.676 m (5\' 6" )   Wt 70.8 kg (156 lb)   LMP 08/09/2022 (Approximate)   BMI 25.18 kg/m                      Physical Exam:    Physical Exam  Constitutional:       Appearance: Normal appearance.   Musculoskeletal:      Lumbar back: Tenderness present. No bony tenderness. Negative right straight leg raise test and negative left straight leg raise test.      Comments: Tenderness R paralumbar muscles   Neurological:      General: No focal deficit present.      Mental Status: She is alert.   Psychiatric:          Mood and Affect: Mood normal.         Behavior: Behavior normal.         Thought Content: Thought content normal.              Assessment:    1. Acute right-sided low back pain without sciatica            Plan:      C/w muscular back pain, reassured  Exercise handout given  Could refer to PT if persistent          Follow-up:    prn        08/11/2022, MD

## 2022-10-04 ENCOUNTER — Encounter (INDEPENDENT_AMBULATORY_CARE_PROVIDER_SITE_OTHER): Payer: Self-pay | Admitting: Family Medicine

## 2023-03-27 ENCOUNTER — Other Ambulatory Visit (INDEPENDENT_AMBULATORY_CARE_PROVIDER_SITE_OTHER): Payer: Self-pay

## 2023-03-27 DIAGNOSIS — B009 Herpesviral infection, unspecified: Secondary | ICD-10-CM

## 2023-07-05 ENCOUNTER — Encounter (INDEPENDENT_AMBULATORY_CARE_PROVIDER_SITE_OTHER): Payer: Self-pay | Admitting: Family Medicine

## 2023-07-05 ENCOUNTER — Ambulatory Visit (INDEPENDENT_AMBULATORY_CARE_PROVIDER_SITE_OTHER): Payer: No Typology Code available for payment source | Admitting: Family Medicine

## 2023-07-05 VITALS — BP 97/64 | HR 60 | Temp 98.2°F | Ht 66.0 in | Wt 153.4 lb

## 2023-07-05 DIAGNOSIS — S060X0A Concussion without loss of consciousness, initial encounter: Secondary | ICD-10-CM

## 2023-07-05 NOTE — Progress Notes (Signed)
Chief Complaint   Patient presents with   . Head Injury     Patient states she hit her head 2 nights ago, while she was sleeping and hit her head on the corner of her night stand. She has had head pain and headaches since. She's had sensitivity to light and loud noises.        Follow-Up     prn    Assessment and Plan     1. Concussion without loss of consciousness, initial encounter    Concussion - reassured patient, discussed avoiding activities that trigger worsening symptoms, expected course. If still not improved in 14 days reach out and would refer to concussion clinic    History of Present Illness      Ms. Brenda Dixon is a 27 y.o. female    Patient hit her head really hard while sleeping two nights ago. . She hit it on the side of the nightstand, on the top of the head. She moves a lot in her sleep. She sleeps on her stomach and lifted her head up and then hit the corner of the nightstand. Her head was throbbing and painful and it took her 20 minutes to fall back asleep. She had a headache all day yesterday, 5-6/10 and constant all day. She works remotely on a laptop and using the computer hurt her head. Loud sounds also seemed to hurt her head. Today she awoke and the headache is less severe but felt a little unwell driving the car into this appointment.       Problem List     Patient Active Problem List   Diagnosis   . Anxiety   . Depressive disorder   . 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 Hypothyroidism   . Infectious mononucleosis   . Lactose intolerance   . Migraine   . Tremor   . Dizziness   . Headache   . Nausea   . Near syncope   . Intermittent palpitations   . PCOS (polycystic ovarian syndrome)   . Herpes simplex       Past Medical History   History reviewed. No pertinent past medical history.    Past Surgical History     Past Surgical History:   Procedure Laterality Date   . MULTIPLE TOOTH EXTRACTIONS  2016       Social History     Social History     Tobacco Use   . Smoking status: Never    . Smokeless tobacco: Never   Vaping Use   . Vaping status: Never Used   Substance Use Topics   . Alcohol use: Yes     Comment: OCC   . Drug use: No       Family History     Family History   Problem Relation Age of Onset   . Hypertension Maternal Grandmother    . Hyperlipidemia Maternal Grandmother    . Diabetes Maternal Grandmother    . Diabetes Maternal Grandfather    . Hyperlipidemia Maternal Grandfather    . Hypertension Maternal Grandfather    . Arthritis Paternal Grandmother    . Depression Paternal Grandmother    . Diabetes Paternal Grandmother    . Myocardial Infarction Paternal Grandmother    . Alcohol abuse Paternal Grandfather      Allergies     No Known Allergies  Medications     Current Outpatient Medications   Medication Sig Dispense Refill   . drospirenone-ethinyl estradiol (YAZ) 3-0.02  MG per tablet Take 1 tablet by mouth daily     . hydrOXYzine (ATARAX) 10 MG tablet Take 1 tablet (10 mg total) by mouth every 8 (eight) hours as needed for Anxiety 30 tablet 0   . tretinoin (RETIN-A) 0.05 % cream      . valACYclovir (VALTREX) 1000 MG tablet TAKE 2 TABLETS BY MOUTH TWICE DAILY FOR 1 DAY FOR COLD SORE OR OUTBREAK 20 tablet 1     No current facility-administered medications for this visit.     Review of Systems     Review of Systems   Constitutional:         All systems per HPI       Physical Exam     Vitals:    07/05/23 1050   BP: 97/64   BP Site: Left arm   Patient Position: Sitting   Cuff Size: Medium   Pulse: 60   Temp: 98.2 F (36.8 C)   TempSrc: Temporal   Weight: 69.6 kg (153 lb 6.4 oz)   Height: 1.676 m (5\' 6" )     Body mass index is 24.76 kg/m.  Physical Exam  Constitutional:       Appearance: Normal appearance.   Neurological:      General: No focal deficit present.      Mental Status: She is alert.      Cranial Nerves: Cranial nerves 2-12 are intact. No cranial nerve deficit, dysarthria or facial asymmetry.      Motor: Motor function is intact. No weakness.      Coordination: Coordination is  intact. Finger-Nose-Finger Test normal.      Gait: Gait normal.      Deep Tendon Reflexes: Reflexes are normal and symmetric.   Psychiatric:         Mood and Affect: Mood normal.         Behavior: Behavior normal.         Thought Content: Thought content normal.       Nila Nephew, MD

## 2023-07-19 ENCOUNTER — Encounter (INDEPENDENT_AMBULATORY_CARE_PROVIDER_SITE_OTHER): Payer: Self-pay | Admitting: Family Medicine

## 2023-08-17 ENCOUNTER — Other Ambulatory Visit: Payer: Self-pay | Admitting: Neurology

## 2023-08-17 DIAGNOSIS — G44309 Post-traumatic headache, unspecified, not intractable: Secondary | ICD-10-CM

## 2023-08-24 ENCOUNTER — Other Ambulatory Visit: Payer: Self-pay | Admitting: Nurse Practitioner

## 2023-08-24 DIAGNOSIS — S060X0A Concussion without loss of consciousness, initial encounter: Secondary | ICD-10-CM

## 2023-09-01 ENCOUNTER — Encounter (INDEPENDENT_AMBULATORY_CARE_PROVIDER_SITE_OTHER): Payer: Self-pay | Admitting: Family Medicine

## 2023-09-02 ENCOUNTER — Ambulatory Visit
Admission: RE | Admit: 2023-09-02 | Discharge: 2023-09-02 | Disposition: A | Discharge status: Home / Self Care | Payer: No Typology Code available for payment source | Source: Ambulatory Visit | Attending: Neurology | Admitting: Neurology

## 2023-09-02 DIAGNOSIS — G44309 Post-traumatic headache, unspecified, not intractable: Secondary | ICD-10-CM | POA: Insufficient documentation

## 2023-09-16 ENCOUNTER — Ambulatory Visit: Payer: No Typology Code available for payment source

## 2023-09-16 ENCOUNTER — Ambulatory Visit: Payer: No Typology Code available for payment source | Admitting: Nurse Practitioner

## 2023-12-15 ENCOUNTER — Ambulatory Visit (INDEPENDENT_AMBULATORY_CARE_PROVIDER_SITE_OTHER): Payer: No Typology Code available for payment source | Admitting: Family Medicine

## 2024-09-15 ENCOUNTER — Ambulatory Visit (INDEPENDENT_AMBULATORY_CARE_PROVIDER_SITE_OTHER): Admitting: Family

## 2024-09-15 ENCOUNTER — Encounter (INDEPENDENT_AMBULATORY_CARE_PROVIDER_SITE_OTHER): Payer: Self-pay

## 2024-09-15 VITALS — BP 121/75 | HR 91 | Temp 97.7°F | Resp 12 | Ht 66.0 in | Wt 160.0 lb

## 2024-09-15 DIAGNOSIS — S61209A Unspecified open wound of unspecified finger without damage to nail, initial encounter: Secondary | ICD-10-CM

## 2024-09-15 NOTE — Patient Instructions (Signed)
 Leave dressing in place for the next 24 hours. May then remove and shower and get area wet. Dermabond will come off on its own in the next 3-5 days. Avoid putting any antibiotic ointment over area as this will make the glue come off. May apply to the wound after glue comes off on its own. F/U for any concerns.

## 2024-09-15 NOTE — Progress Notes (Signed)
 [place] [facility]  OFFICE NOTE         Subjective   Historian: Patient    Chief Complaint   Patient presents with    Laceration     Pt cut herself shaving last night.     HPI  Kaycie is a 28 y.o. female who presents for laceration to the tip of her left ring finger. States she was shaving her legs and some how cut a piece of the tip of the finger off. She states she had a really hard time getting the bleeding to stop and then this morning when she finally got the gauze off it started bleeding again. Thought she should come get it checked out.     History:  Medications and Allergies reviewed.   Pertinent Past Medical, Surgical, Family and Social History were reviewed.        Objective     Vitals:    09/15/24 1052   BP: 121/75   BP Site: Left arm   Patient Position: Sitting   Cuff Size: Medium   Pulse: 91   Resp: 12   Temp: 97.7 F (36.5 C)   TempSrc: Tympanic   SpO2: 99%   Weight: 72.6 kg (160 lb)   Height: 1.676 m (5' 6)     Physical Exam  Constitutional:       Appearance: Normal appearance.   HENT:      Head: Normocephalic.   Pulmonary:      Effort: Pulmonary effort is normal.   Neurological:      Mental Status: She is alert and oriented to person, place, and time.   Skin:     General: Skin is warm.      Findings: Laceration present.   Psychiatric:         Behavior: Behavior normal.   Vitals and nursing note reviewed.     Urgent Care Course   There were no labs reviewed with this patient during the visit.    There were no x-rays reviewed with this patient during the visit.    Procedures   Procedures     Assessment / Plan     Differential Diagnoses including but not limited to: laceration    Leave dressing in place for the next 24 hours. May then remove and shower and get area wet. Dermabond will come off on its own in the next 3-5 days. Avoid putting any antibiotic ointment over area as this will make the glue come off. May apply to the wound after glue comes off on its own. F/U for any concerns.      Domanique was seen today for laceration.    Diagnoses and all orders for this visit:    Avulsion of skin of finger, initial encounter         The indications for early follow-up with PCP and return to UC were discussed. Patient/family received education on the working diagnosis, diagnostic uncertainties, and proposed treatment plan. Indications for emergency evaluation in the ED were reviewed. Written and verbal discharge instructions were provided and discussed and all questions from the patient/family were addressed, with no apparent barriers.

## 2024-11-05 ENCOUNTER — Other Ambulatory Visit (INDEPENDENT_AMBULATORY_CARE_PROVIDER_SITE_OTHER): Payer: Self-pay

## 2025-01-14 ENCOUNTER — Encounter (INDEPENDENT_AMBULATORY_CARE_PROVIDER_SITE_OTHER): Payer: Self-pay | Admitting: Family Medicine
